# Patient Record
Sex: Male | Born: 1940 | Race: White | Hispanic: No | Smoking: Former smoker
Health system: Southern US, Community
[De-identification: ages and names within clinical notes are randomized; demographics above are authoritative.]

## PROBLEM LIST (undated history)

## (undated) DIAGNOSIS — I639 Cerebral infarction, unspecified: Secondary | ICD-10-CM

## (undated) DIAGNOSIS — J189 Pneumonia, unspecified organism: Secondary | ICD-10-CM

## (undated) DIAGNOSIS — I10 Essential (primary) hypertension: Secondary | ICD-10-CM

## (undated) DIAGNOSIS — A938 Other specified arthropod-borne viral fevers: Secondary | ICD-10-CM

## (undated) DIAGNOSIS — E7800 Pure hypercholesterolemia, unspecified: Secondary | ICD-10-CM

## (undated) DIAGNOSIS — K219 Gastro-esophageal reflux disease without esophagitis: Secondary | ICD-10-CM

## (undated) DIAGNOSIS — F209 Schizophrenia, unspecified: Secondary | ICD-10-CM

## (undated) HISTORY — PX: APPENDECTOMY: SHX54

## (undated) HISTORY — PX: ESOPHAGUS SURGERY: SHX626

---

## 2005-08-29 ENCOUNTER — Emergency Department (HOSPITAL_COMMUNITY): Admission: EM | Admit: 2005-08-29 | Discharge: 2005-08-29 | Payer: Self-pay | Admitting: Emergency Medicine

## 2005-09-12 ENCOUNTER — Ambulatory Visit (HOSPITAL_COMMUNITY): Admission: RE | Admit: 2005-09-12 | Discharge: 2005-09-12 | Payer: Self-pay | Admitting: Family Medicine

## 2006-07-18 DIAGNOSIS — I639 Cerebral infarction, unspecified: Secondary | ICD-10-CM

## 2006-07-18 HISTORY — DX: Cerebral infarction, unspecified: I63.9

## 2006-11-09 ENCOUNTER — Encounter: Payer: Self-pay | Admitting: Emergency Medicine

## 2006-11-10 ENCOUNTER — Inpatient Hospital Stay (HOSPITAL_COMMUNITY): Admission: EM | Admit: 2006-11-10 | Discharge: 2006-11-14 | Payer: Self-pay | Admitting: Emergency Medicine

## 2006-11-11 ENCOUNTER — Ambulatory Visit: Payer: Self-pay | Admitting: Internal Medicine

## 2006-11-28 ENCOUNTER — Ambulatory Visit: Payer: Self-pay | Admitting: Internal Medicine

## 2008-07-14 ENCOUNTER — Ambulatory Visit (HOSPITAL_COMMUNITY): Admission: RE | Admit: 2008-07-14 | Discharge: 2008-07-14 | Payer: Self-pay | Admitting: Family Medicine

## 2008-11-12 ENCOUNTER — Ambulatory Visit (HOSPITAL_COMMUNITY): Admission: RE | Admit: 2008-11-12 | Discharge: 2008-11-12 | Payer: Self-pay | Admitting: Family Medicine

## 2008-11-18 ENCOUNTER — Encounter: Payer: Self-pay | Admitting: Internal Medicine

## 2008-11-21 ENCOUNTER — Ambulatory Visit (HOSPITAL_COMMUNITY): Admission: RE | Admit: 2008-11-21 | Discharge: 2008-11-21 | Payer: Self-pay | Admitting: Family Medicine

## 2009-01-28 ENCOUNTER — Encounter (INDEPENDENT_AMBULATORY_CARE_PROVIDER_SITE_OTHER): Payer: Self-pay | Admitting: General Surgery

## 2009-01-28 ENCOUNTER — Inpatient Hospital Stay (HOSPITAL_COMMUNITY): Admission: EM | Admit: 2009-01-28 | Discharge: 2009-02-11 | Payer: Self-pay | Admitting: Emergency Medicine

## 2009-02-04 ENCOUNTER — Encounter: Payer: Self-pay | Admitting: Podiatry

## 2010-10-24 LAB — COMPREHENSIVE METABOLIC PANEL
ALT: 12 U/L (ref 0–53)
ALT: 9 U/L (ref 0–53)
ALT: <8 U/L (ref 0–53)
ALT: 8 U/L (ref 0–53)
AST: 17 U/L (ref 0–37)
AST: 19 U/L (ref 0–37)
AST: 17 U/L (ref 0–37)
Albumin: 1.5 g/dL — ABNORMAL LOW (ref 3.5–5.2)
Albumin: 1.8 g/dL — ABNORMAL LOW (ref 3.5–5.2)
Albumin: 1.9 g/dL — ABNORMAL LOW (ref 3.5–5.2)
Albumin: 2 g/dL — ABNORMAL LOW (ref 3.5–5.2)
Albumin: 2.6 g/dL — ABNORMAL LOW (ref 3.5–5.2)
Alkaline Phosphatase: 51 U/L (ref 39–117)
Alkaline Phosphatase: 53 U/L (ref 39–117)
Alkaline Phosphatase: 60 U/L (ref 39–117)
Alkaline Phosphatase: 51 U/L (ref 39–117)
BUN: 11 mg/dL (ref 6–23)
BUN: 18 mg/dL (ref 6–23)
BUN: 21 mg/dL (ref 6–23)
BUN: 24 mg/dL — ABNORMAL HIGH (ref 6–23)
BUN: 27 mg/dL — ABNORMAL HIGH (ref 6–23)
BUN: 26 mg/dL — ABNORMAL HIGH (ref 6–23)
BUN: 32 mg/dL — ABNORMAL HIGH (ref 6–23)
CO2: 30 mEq/L (ref 19–32)
CO2: 32 mEq/L (ref 19–32)
CO2: 33 mEq/L — ABNORMAL HIGH (ref 19–32)
Calcium: 7.7 mg/dL — ABNORMAL LOW (ref 8.4–10.5)
Calcium: 7.9 mg/dL — ABNORMAL LOW (ref 8.4–10.5)
Calcium: 8.1 mg/dL — ABNORMAL LOW (ref 8.4–10.5)
Calcium: 8.1 mg/dL — ABNORMAL LOW (ref 8.4–10.5)
Calcium: 8.2 mg/dL — ABNORMAL LOW (ref 8.4–10.5)
Calcium: 7.3 mg/dL — ABNORMAL LOW (ref 8.4–10.5)
Calcium: 7.4 mg/dL — ABNORMAL LOW (ref 8.4–10.5)
Chloride: 101 mEq/L (ref 96–112)
Chloride: 102 mEq/L (ref 96–112)
Chloride: 102 mEq/L (ref 96–112)
Chloride: 103 mEq/L (ref 96–112)
Chloride: 97 mEq/L (ref 96–112)
Creatinine, Ser: 0.73 mg/dL (ref 0.4–1.5)
Creatinine, Ser: 0.97 mg/dL (ref 0.4–1.5)
Creatinine, Ser: 1.03 mg/dL (ref 0.4–1.5)
Creatinine, Ser: 1.18 mg/dL (ref 0.4–1.5)
Creatinine, Ser: 1.27 mg/dL (ref 0.4–1.5)
Creatinine, Ser: 1.74 mg/dL — ABNORMAL HIGH (ref 0.4–1.5)
Creatinine, Ser: 2.69 mg/dL — ABNORMAL HIGH (ref 0.4–1.5)
GFR calc Af Amer: 56 mL/min — ABNORMAL LOW (ref >60)
GFR calc Af Amer: >60 mL/min (ref >60)
GFR calc Af Amer: >60 mL/min (ref >60)
GFR calc Af Amer: >60 mL/min (ref >60)
GFR calc Af Amer: 29 mL/min — ABNORMAL LOW (ref >60)
GFR calc non Af Amer: >60 mL/min (ref >60)
GFR calc non Af Amer: >60 mL/min (ref >60)
GFR calc non Af Amer: >60 mL/min (ref >60)
GFR calc non Af Amer: 39 mL/min — ABNORMAL LOW (ref >60)
Glucose, Bld: 101 mg/dL — ABNORMAL HIGH (ref 70–99)
Glucose, Bld: 109 mg/dL — ABNORMAL HIGH (ref 70–99)
Glucose, Bld: 121 mg/dL — ABNORMAL HIGH (ref 70–99)
Glucose, Bld: 111 mg/dL — ABNORMAL HIGH (ref 70–99)
Potassium: 4.5 mEq/L (ref 3.5–5.1)
Potassium: 4.6 mEq/L (ref 3.5–5.1)
Potassium: 4.6 mEq/L (ref 3.5–5.1)
Potassium: 4.3 mEq/L (ref 3.5–5.1)
Potassium: 4.4 mEq/L (ref 3.5–5.1)
Sodium: 136 mEq/L (ref 135–145)
Sodium: 139 mEq/L (ref 135–145)
Sodium: 140 mEq/L (ref 135–145)
Sodium: 142 mEq/L (ref 135–145)
Sodium: 134 mEq/L — ABNORMAL LOW (ref 135–145)
Total Bilirubin: 0.4 mg/dL (ref 0.3–1.2)
Total Bilirubin: 0.6 mg/dL (ref 0.3–1.2)
Total Bilirubin: 0.7 mg/dL (ref 0.3–1.2)
Total Bilirubin: 0.7 mg/dL (ref 0.3–1.2)
Total Bilirubin: 0.4 mg/dL (ref 0.3–1.2)
Total Protein: 4.8 g/dL — ABNORMAL LOW (ref 6.0–8.3)
Total Protein: 4.9 g/dL — ABNORMAL LOW (ref 6.0–8.3)
Total Protein: 5 g/dL — ABNORMAL LOW (ref 6.0–8.3)
Total Protein: 5.1 g/dL — ABNORMAL LOW (ref 6.0–8.3)
Total Protein: 5.8 g/dL — ABNORMAL LOW (ref 6.0–8.3)
Total Protein: 5 g/dL — ABNORMAL LOW (ref 6.0–8.3)
Total Protein: 5.2 g/dL — ABNORMAL LOW (ref 6.0–8.3)
Total Protein: 5.3 g/dL — ABNORMAL LOW (ref 6.0–8.3)

## 2010-10-24 LAB — DIFFERENTIAL
Basophils Absolute: 0 10*3/uL (ref 0.0–0.1)
Basophils Absolute: 0.1 10*3/uL (ref 0.0–0.1)
Basophils Absolute: 0.2 10*3/uL — ABNORMAL HIGH (ref 0.0–0.1)
Basophils Absolute: 0 10*3/uL (ref 0.0–0.1)
Basophils Absolute: 0 10*3/uL (ref 0.0–0.1)
Basophils Relative: 0 % (ref 0–1)
Basophils Relative: 1 % (ref 0–1)
Eosinophils Absolute: 0.2 10*3/uL (ref 0.0–0.7)
Eosinophils Absolute: 0.2 10*3/uL (ref 0.0–0.7)
Eosinophils Absolute: 0.3 10*3/uL (ref 0.0–0.7)
Eosinophils Absolute: 0.3 10*3/uL (ref 0.0–0.7)
Eosinophils Absolute: 0.4 10*3/uL (ref 0.0–0.7)
Eosinophils Relative: 1 % (ref 0–5)
Eosinophils Relative: 2 % (ref 0–5)
Eosinophils Relative: 3 % (ref 0–5)
Eosinophils Relative: 0 % (ref 0–5)
Eosinophils Relative: 0 % (ref 0–5)
Lymphocytes Relative: 10 % — ABNORMAL LOW (ref 12–46)
Lymphocytes Relative: 12 % (ref 12–46)
Lymphocytes Relative: 12 % (ref 12–46)
Lymphocytes Relative: 15 % (ref 12–46)
Lymphocytes Relative: 9 % — ABNORMAL LOW (ref 12–46)
Lymphocytes Relative: 5 % — ABNORMAL LOW (ref 12–46)
Lymphocytes Relative: 6 % — ABNORMAL LOW (ref 12–46)
Lymphs Abs: 1.2 10*3/uL (ref 0.7–4.0)
Lymphs Abs: 1.6 10*3/uL (ref 0.7–4.0)
Lymphs Abs: 1.6 10*3/uL (ref 0.7–4.0)
Lymphs Abs: 2 10*3/uL (ref 0.7–4.0)
Lymphs Abs: 2.1 10*3/uL (ref 0.7–4.0)
Lymphs Abs: 2.2 10*3/uL (ref 0.7–4.0)
Lymphs Abs: 2.3 10*3/uL (ref 0.7–4.0)
Lymphs Abs: 1.2 10*3/uL (ref 0.7–4.0)
Lymphs Abs: 1.3 10*3/uL (ref 0.7–4.0)
Monocytes Absolute: 1.4 10*3/uL — ABNORMAL HIGH (ref 0.1–1.0)
Monocytes Absolute: 1.4 10*3/uL — ABNORMAL HIGH (ref 0.1–1.0)
Monocytes Absolute: 1.5 10*3/uL — ABNORMAL HIGH (ref 0.1–1.0)
Monocytes Absolute: 1.5 10*3/uL — ABNORMAL HIGH (ref 0.1–1.0)
Monocytes Absolute: 1.6 10*3/uL — ABNORMAL HIGH (ref 0.1–1.0)
Monocytes Absolute: 2.3 10*3/uL — ABNORMAL HIGH (ref 0.1–1.0)
Monocytes Absolute: 0.5 10*3/uL (ref 0.1–1.0)
Monocytes Absolute: 0.7 10*3/uL (ref 0.1–1.0)
Monocytes Relative: 13 % — ABNORMAL HIGH (ref 3–12)
Monocytes Relative: 13 % — ABNORMAL HIGH (ref 3–12)
Monocytes Relative: 7 % (ref 3–12)
Monocytes Relative: 7 % (ref 3–12)
Monocytes Relative: 8 % (ref 3–12)
Monocytes Relative: 8 % (ref 3–12)
Monocytes Relative: 9 % (ref 3–12)
Monocytes Relative: 9 % (ref 3–12)
Monocytes Relative: 2 % — ABNORMAL LOW (ref 3–12)
Monocytes Relative: 3 % (ref 3–12)
Monocytes Relative: 3 % (ref 3–12)
Neutro Abs: 10 10*3/uL — ABNORMAL HIGH (ref 1.7–7.7)
Neutro Abs: 13.3 10*3/uL — ABNORMAL HIGH (ref 1.7–7.7)
Neutro Abs: 13.7 10*3/uL — ABNORMAL HIGH (ref 1.7–7.7)
Neutro Abs: 14.6 10*3/uL — ABNORMAL HIGH (ref 1.7–7.7)
Neutro Abs: 14.7 10*3/uL — ABNORMAL HIGH (ref 1.7–7.7)
Neutro Abs: 14.5 10*3/uL — ABNORMAL HIGH (ref 1.7–7.7)
Neutro Abs: 17.4 10*3/uL — ABNORMAL HIGH (ref 1.7–7.7)
Neutro Abs: 20 10*3/uL — ABNORMAL HIGH (ref 1.7–7.7)
Neutrophils Relative %: 76 % (ref 43–77)
Neutrophils Relative %: 76 % (ref 43–77)
Neutrophils Relative %: 82 % — ABNORMAL HIGH (ref 43–77)
Neutrophils Relative %: 90 % — ABNORMAL HIGH (ref 43–77)
WBC Morphology: INCREASED

## 2010-10-24 LAB — CBC
HCT: 29 % — ABNORMAL LOW (ref 39.0–52.0)
HCT: 29.8 % — ABNORMAL LOW (ref 39.0–52.0)
HCT: 30.4 % — ABNORMAL LOW (ref 39.0–52.0)
HCT: 30.5 % — ABNORMAL LOW (ref 39.0–52.0)
HCT: 30.9 % — ABNORMAL LOW (ref 39.0–52.0)
HCT: 31.3 % — ABNORMAL LOW (ref 39.0–52.0)
HCT: 34.2 % — ABNORMAL LOW (ref 39.0–52.0)
HCT: 34.4 % — ABNORMAL LOW (ref 39.0–52.0)
HCT: 34.6 % — ABNORMAL LOW (ref 39.0–52.0)
HCT: 36.3 % — ABNORMAL LOW (ref 39.0–52.0)
Hemoglobin: 10.2 g/dL — ABNORMAL LOW (ref 13.0–17.0)
Hemoglobin: 10.3 g/dL — ABNORMAL LOW (ref 13.0–17.0)
Hemoglobin: 10.6 g/dL — ABNORMAL LOW (ref 13.0–17.0)
Hemoglobin: 10.9 g/dL — ABNORMAL LOW (ref 13.0–17.0)
Hemoglobin: 12 g/dL — ABNORMAL LOW (ref 13.0–17.0)
Hemoglobin: 12 g/dL — ABNORMAL LOW (ref 13.0–17.0)
Hemoglobin: 12.7 g/dL — ABNORMAL LOW (ref 13.0–17.0)
MCHC: 34.2 g/dL (ref 30.0–36.0)
MCHC: 34.3 g/dL (ref 30.0–36.0)
MCHC: 34.7 g/dL (ref 30.0–36.0)
MCHC: 34.8 g/dL (ref 30.0–36.0)
MCHC: 34.7 g/dL (ref 30.0–36.0)
MCV: 92.3 fL (ref 78.0–100.0)
MCV: 93 fL (ref 78.0–100.0)
MCV: 93.2 fL (ref 78.0–100.0)
MCV: 93.4 fL (ref 78.0–100.0)
MCV: 93.6 fL (ref 78.0–100.0)
MCV: 93.7 fL (ref 78.0–100.0)
MCV: 93.7 fL (ref 78.0–100.0)
MCV: 93.7 fL (ref 78.0–100.0)
MCV: 93.9 fL (ref 78.0–100.0)
MCV: 94.3 fL (ref 78.0–100.0)
MCV: 93 fL (ref 78.0–100.0)
MCV: 93.7 fL (ref 78.0–100.0)
Platelets: 291 10*3/uL (ref 150–400)
Platelets: 397 10*3/uL (ref 150–400)
Platelets: 422 10*3/uL — ABNORMAL HIGH (ref 150–400)
Platelets: 425 10*3/uL — ABNORMAL HIGH (ref 150–400)
Platelets: 444 10*3/uL — ABNORMAL HIGH (ref 150–400)
Platelets: 451 10*3/uL — ABNORMAL HIGH (ref 150–400)
Platelets: 228 10*3/uL (ref 150–400)
RBC: 3.14 MIL/uL — ABNORMAL LOW (ref 4.22–5.81)
RBC: 3.21 MIL/uL — ABNORMAL LOW (ref 4.22–5.81)
RBC: 3.25 MIL/uL — ABNORMAL LOW (ref 4.22–5.81)
RBC: 3.27 MIL/uL — ABNORMAL LOW (ref 4.22–5.81)
RBC: 3.28 MIL/uL — ABNORMAL LOW (ref 4.22–5.81)
RBC: 3.34 MIL/uL — ABNORMAL LOW (ref 4.22–5.81)
RBC: 3.4 MIL/uL — ABNORMAL LOW (ref 4.22–5.81)
RBC: 3.91 MIL/uL — ABNORMAL LOW (ref 4.22–5.81)
RDW: 14.2 % (ref 11.5–15.5)
RDW: 14.4 % (ref 11.5–15.5)
RDW: 14.5 % (ref 11.5–15.5)
RDW: 14.9 % (ref 11.5–15.5)
RDW: 13.3 % (ref 11.5–15.5)
RDW: 13.5 % (ref 11.5–15.5)
RDW: 13.6 % (ref 11.5–15.5)
WBC: 12.2 10*3/uL — ABNORMAL HIGH (ref 4.0–10.5)
WBC: 17.1 10*3/uL — ABNORMAL HIGH (ref 4.0–10.5)
WBC: 17.4 10*3/uL — ABNORMAL HIGH (ref 4.0–10.5)
WBC: 17.4 10*3/uL — ABNORMAL HIGH (ref 4.0–10.5)
WBC: 17.7 10*3/uL — ABNORMAL HIGH (ref 4.0–10.5)
WBC: 18.4 10*3/uL — ABNORMAL HIGH (ref 4.0–10.5)
WBC: 18.4 10*3/uL — ABNORMAL HIGH (ref 4.0–10.5)
WBC: 19.3 10*3/uL — ABNORMAL HIGH (ref 4.0–10.5)
WBC: 22 10*3/uL — ABNORMAL HIGH (ref 4.0–10.5)

## 2010-10-24 LAB — BASIC METABOLIC PANEL
BUN: 16 mg/dL (ref 6–23)
BUN: 8 mg/dL (ref 6–23)
Chloride: 102 mEq/L (ref 96–112)
Chloride: 104 mEq/L (ref 96–112)
Chloride: 105 mEq/L (ref 96–112)
Chloride: 107 mEq/L (ref 96–112)
Creatinine, Ser: 0.83 mg/dL (ref 0.4–1.5)
Creatinine, Ser: 0.91 mg/dL (ref 0.4–1.5)
GFR calc Af Amer: >60 mL/min (ref >60)
GFR calc Af Amer: >60 mL/min (ref >60)
GFR calc non Af Amer: >60 mL/min (ref >60)
GFR calc non Af Amer: >60 mL/min (ref >60)
Potassium: 4.3 mEq/L (ref 3.5–5.1)
Potassium: 4.6 mEq/L (ref 3.5–5.1)
Potassium: 4.7 mEq/L (ref 3.5–5.1)
Sodium: 139 mEq/L (ref 135–145)
Sodium: 141 mEq/L (ref 135–145)

## 2010-10-24 LAB — POCT CARDIAC MARKERS
CKMB, poc: 1.2 ng/mL (ref 1.0–8.0)
Myoglobin, poc: >500 ng/mL (ref 12–200)

## 2010-10-24 LAB — URINE CULTURE: Colony Count: NO GROWTH

## 2010-10-24 LAB — PHOSPHORUS
Phosphorus: 2.8 mg/dL (ref 2.3–4.6)
Phosphorus: 3.1 mg/dL (ref 2.3–4.6)
Phosphorus: 3.7 mg/dL (ref 2.3–4.6)
Phosphorus: 4.3 mg/dL (ref 2.3–4.6)
Phosphorus: 2.8 mg/dL (ref 2.3–4.6)
Phosphorus: 3.5 mg/dL (ref 2.3–4.6)

## 2010-10-24 LAB — CULTURE, ROUTINE-ABSCESS

## 2010-10-24 LAB — CULTURE, BLOOD (ROUTINE X 2)
Culture: NO GROWTH
Culture: NO GROWTH
Report Status: 7292010

## 2010-10-24 LAB — TYPE AND SCREEN: Antibody Screen: NEGATIVE

## 2010-10-24 LAB — PROTIME-INR: INR: 1.4 (ref 0.00–1.49)

## 2010-10-24 LAB — GLUCOSE, CAPILLARY: Glucose-Capillary: 126 mg/dL — ABNORMAL HIGH (ref 70–99)

## 2010-10-24 LAB — MAGNESIUM
Magnesium: 2 mg/dL (ref 1.5–2.5)
Magnesium: 2.1 mg/dL (ref 1.5–2.5)
Magnesium: 2.1 mg/dL (ref 1.5–2.5)
Magnesium: 2.2 mg/dL (ref 1.5–2.5)
Magnesium: 2.5 mg/dL (ref 1.5–2.5)
Magnesium: 2.6 mg/dL — ABNORMAL HIGH (ref 1.5–2.5)
Magnesium: 2.8 mg/dL — ABNORMAL HIGH (ref 1.5–2.5)

## 2010-10-24 LAB — ANAEROBIC CULTURE

## 2010-10-24 LAB — URINALYSIS, ROUTINE W REFLEX MICROSCOPIC
Bilirubin Urine: NEGATIVE
Protein, ur: 100 mg/dL — AB
Urobilinogen, UA: 0.2 mg/dL (ref 0.0–1.0)

## 2010-10-24 LAB — VALPROIC ACID LEVEL: Valproic Acid Lvl: 60 ug/mL (ref 50.0–100.0)

## 2010-10-24 LAB — AMMONIA: Ammonia: 15 umol/L (ref 11–35)

## 2010-11-30 NOTE — Group Therapy Note (Signed)
NAMEAYDENN, Roger Frye                 ACCOUNT NO.:  [id number]   MEDICAL RECORD NO.:  [id number]          PATIENT TYPE:  INP   LOCATION:  A341                          FACILITY:  [facility]   PHYSICIAN:  Mila Homer. Sudie Bailey, M.D.DATE OF BIRTH:  01-26-1941   DATE OF PROCEDURE:  DATE OF DISCHARGE:                                 PROGRESS NOTE   SUBJECTIVE:  His brother said he was awake more last night.  He does not  seem to have changed his speech patterns.  His brother thinks he has  problem with his tongue.   OBJECTIVE:  Temperature is 97.3, pulse 93, respiratory rate 18, blood  pressure 134/63.  He is edentulous, at least in his upper jaw.  He is  still talking and sentence structure is barely perceived to be normal,  but his use of words is very, very garbled.  His O2 sat is 94% on 2 L  and 91% on 2 L.  His color is pretty good.  His lungs show decreased  breath sounds throughout, but he is moving air well, no intercostal  retraction, no use of accessory muscles of respiration.  Heart is a  regular rhythm without murmur rate of 90.  Abdomen fairly soft without  tenderness and a drain is still in place.   His white cell count today remains high at 18,400 of which 81%  neutrophils, 9 lymphs.  Hemoglobin 10.4.  MET 7 shows a bicarb of 33 and  albumin of 1.5.   His CT of the abdomen and pelvis done yesterday showed an interval  appendectomy and a collection of peritoneal fluid in a subhepatic  location measuring 12.8 x 5.1 x 7.5 cm, another loculated fluid  collection in the mesentery to right lower quadrant.  There was  inflammatory edematous changes in the mesenteric fat, 2 either fluid  collections in the pelvis, one measuring 4 x 4.5 x 6.9 the other 3.3 x  3.0 x 5.4 cm.  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   ASSESSMENT:  1. Status post surgery for ruptured appendix.  2.  Question abdominal abscesses.  3. Bacteremia secondary to Bacteroides fragilis beta-lactamase      positive.  4. Chronic obstructive pulmonary disease.  5. Tobacco use disorder.  6. Difficulty talking which may be due to medication or generalized      sickness.   PLAN:  We will discuss with his surgeon, Dr. Tilford Pillar.  He may need  to have tapping of these fluid collections in the abdomen.  We will  increase his Zyprexa again to 20 mg at bedtime to get him sleep better  at night.  I think decrease in the dose has not helped his speech.      Mila Homer. Sudie Bailey, M.D.  Electronically Signed     SDK/MEDQ  D:  02/03/2009  T:  02/04/2009  Job:  [id number]

## 2010-11-30 NOTE — Discharge Summary (Signed)
NAMEKYLEN, SCHLIEP                 ACCOUNT NO.:  [id number]   MEDICAL RECORD NO.:  [id number]          PATIENT TYPE:  INP   LOCATION:  A341                          FACILITY:  [facility]   PHYSICIAN:  Tilford Pillar, MD      DATE OF BIRTH:  April 06, 1941   DATE OF ADMISSION:  01/28/2009  DATE OF DISCHARGE:  07/28/2010LH                               DISCHARGE SUMMARY   ADMISSION DIAGNOSIS:  Acute appendicitis.   DISCHARGE DIAGNOSES:  1. Perforated appendicitis status post laparoscopic appendectomy.  2. Hypertension.  3. History of cerebrovascular accident.  4. Seizure disorder.  5. History of upper gastrointestinal bleed.  6. Schizophrenia.  7. History of ventral hernia.  8. Mild mental retardation and developmental deficiency.  9. Urinary retention.   PROCEDURES:  1. Laparoscopic appendectomy.  2. Percutaneous drain via interventional radiology placement.   DISPOSITION:  Extended care facility for continued rehab.   BRIEF HISTORY OF PRESENT ILLNESS:  Please see the admission History and  Physical for the complete H and P.  The patient is a 70 year old male  well cared for by his brothers who presented with increasing abdominal  pain, nausea and vomiting.  On evaluation, he had signs and symptoms  consistent with appendicitis.  He did not have any diffuse  symptomatology.  It was recommended he go to the operating room for  emergent laparoscopic appendectomy and possible open appendectomy.  He  was admitted for the planned procedure and continued management.   HOSPITAL COURSE:  The patient was admitted on January 28, 2009.  He  underwent the above mentioned laparoscopic appendectomy at which time it  was noted that the patient had diffuse purulent fluid within the  abdomen.  The appendix was removed in standard fashion and the abdomen  was irrigated with a copious amounts of sterile saline.  Please see the  operative report for details on the actual procedure.  Following the  procedure, he was initially transferred to the intensive care unit.  He  was monitored over the subsequent days and eventually transferred to a  surgical floor.  His hospital course was somewhat delayed due to slow  return of bowel function and persistent leukocytosis.  During his  postoperative course due to his persistent leukocytosis, I did opt to  proceed with re-evaluating the abdomen with CT evaluation.  This did  demonstrate an extremely large right pericolic gutter collection as well  as interloop fluid collections.  These did not have the classic  appearance of an abscess, however due to the persistent leukocytosis I  did request interventional radiology consultation.  The patient was  transferred briefly to [facility] for interventional radiology placement  of a percutaneous drain.  Fluid was aspirated.  It was clear and no  evidence of any overt abscess.  He was continued on the antibiotics and  had a constant leukocytosis of 17,000.  Overall his symptomatology  improved greatly.  He did have return of bowel function.  He was started  back on a diet.  He tolerated the diet extremely well, he continued to  have bowel  function with the persistent leukocytosis with no evidence of  any febrile episodes and no clear evidence of infectious etiology on  chest x-ray, blood cultures or urinalysis as well as no suspicion of  intra-abdominal undrained abscess I opted at this time to discontinue  his antibiotics.  Immediately following this, he did have slow trending  down of his white blood cell count being off of all antibiotics making  me suspicious that his leukocytosis was medication induced.  At this  time the patient has tolerated a regular diet, he is comfortable, he is  very alert and is ready to be discharged for continued rehab to increase  his strength.  Plans are made for discharge on February 10, 2009 being  transferred on February 11, 2009.   DISCHARGE INSTRUCTIONS:  The patient  will be continued on his drains.  He will be continued on diet as tolerated.  Drains will be removed as an  outpatient in my office.  He will continue to get physical therapy to  increase his strength.  The patient will be discharged with a Foley  catheter which was placed due to some urinary retention, likely due to a  benign urethral or prostatic process.  Again as the patient has  continued to improve and diurese, this will be removed as an outpatient  over the next week as well as discussed with the family and the patient.      Tilford Pillar, MD  Electronically Signed     BZ/MEDQ  D:  02/11/2009  T:  02/11/2009  Job:  [id number]

## 2010-11-30 NOTE — H&P (Signed)
Roger Frye, Roger Frye                 ACCOUNT NO.:  [id number]   MEDICAL RECORD NO.:  [id number]          PATIENT TYPE:  INP   LOCATION:  IC04                          FACILITY:  [facility]   PHYSICIAN:  Tilford Pillar, MD      DATE OF BIRTH:  1940/12/25   DATE OF ADMISSION:  01/28/2009  DATE OF DISCHARGE:  LH                              HISTORY & PHYSICAL   CHIEF COMPLAINT:  Abdominal pain, dehydration.   HISTORY OF PRESENT ILLNESS:  The patient is a 70 year old male with a  history of hypertension, seizure disorder, and prior CVA who presents  with approximately 3 days increasing abdominal pain that started  periumbilically with localization to the right lower quadrant, it is  sharp and is constant.  It is exacerbated with palpation and movements.  He has had no fever or chills.  He has had decrease in appetite.  He has  had diminished bowel function over the last 1-2 days.  He has had  associated nausea and some small amounts of nonbloody emesis.  He has  had no similar episodes in the past.  Over this time, he has also  complained of some shortness of breath with some cough, which has  developed over the last 1-2 days with the increasing abdominal pain.  This has been nonproductive.  He has had again no fever or chills.  No  sick contacts.   PAST MEDICAL HISTORY:  Hypertension, cerebrovascular accident, seizure  disorder with no recent seizure activity, history of upper  gastrointestinal bleed with a Mallory-Weiss tear after forceful emesis  and choking, schizophrenia, ventral hernia.   PAST SURGICAL HISTORY:  None.   MEDICATIONS:  Simvastatin, lorazepam, Zyprexa, oxybutynin, divalproex,  aspirin, and enalapril.   ALLERGIES:  No known drug allergies.   SOCIAL HISTORY:  He is a current tobacco smoker.  No alcohol use.  No  recreational drug use.   REVIEW OF SYSTEMS:  CONSTITUTIONAL:  Unremarkable.  EYES:  Unremarkable.  EARS, NOSE AND THROAT:  Unremarkable.  RESPIRATORY: Some  cough over the  last couple days as per HPI.  CARDIOVASCULAR:  Unremarkable.  No  complaint of chest pain.  No palpitations.  GASTROINTESTINAL:  As per  HPI.  GENITOURINARY:  Decreased urine output today, otherwise  unremarkable.  MUSCULOSKELETAL:  He does have some left-sided weakness  and does have some weakness secondarily to his past cerebrovascular  accident.  SKIN:  Unremarkable.  NEURO:  Unremarkable other than HPI and  history of CVA.   PHYSICAL EXAMINATION:  VITAL SIGNS:  Temperature 98.1, heart rate 102,  respirations 20, blood pressure 132/67.  GENERAL:  The patient is lying in a supine position in the emergency  department.  He is alert and oriented.  He is not in any acute distress.  His speech is somewhat difficult to understand.  The patient and brother  who is present says this has improved since his admission, and has not  been consistent with his prior history of stroke.  He feels that it is  difficult to verbalize secondarily to his dry mouth.  HEENT:  Scalp, no deformities.  Eyes, pupils were equal, round, and  reactive.  Extraocular movements are intact.  No scleral icterus is  noted.  No conjunctival pallor is appreciated.  Oral mucosa is pink and  is tacky at this time.  Trachea is midline.  PULMONARY:  Unlabored respirations.  He does have left lower lung field  crackles with deep inhalation.  No rhonchi are appreciated.  Breath  sounds on the right appear clear throughout lung fields.  CARDIOVASCULAR:  Regular rate and rhythm.  ABDOMEN:  Diminished bowel sounds.  Abdomen is soft, obese, and is  moderately distended.  He does have positive right lower quadrant pain.  At [place] point, he has some voluntary guarding in this area.  He has  no diffuse peritoneal signs noted.  No appreciated Rovsing sign.  He  does have a palpable ventral/epigastric hernia.  This is nonreducible.  EXTREMITIES:  Warm and dry.   PERTINENT LABORATORY AND RADIOGRAPHIC STUDIES:  CBC,  white blood cell  count 22, hemoglobin 12.1, hematocrit 35.1, platelets 239.  He has a  shift with neutrophil count of 91.  Basic metabolic panel, sodium 133,  potassium 4.3, chloride 97, bicarb 32, BUN 45, creatinine 2.69, blood  glucose 128.  UA demonstrates positive blood and casts.  No leukocytes.  CT of the abdomen demonstrates no evidence of any free air.  There is  some small amount of what appears to be free pelvic fluid, although this  is noncontrast CT due to his elevated creatinine.  There is appendiceal  dilatation noted.  There is also some opacification and consolidation of  the left lung base.   ASSESSMENT AND PLAN:  1. Acute appendicitis.  2. Acute renal insufficiency.  3. Dehydration.  4. Chronic incarcerated and epigastric ventral hernia.  5. Atelectatic changes of the left lung base.   Plan at this point is to continue the patient on an n.p.o. status and  continue aggressive IV fluid hydration.  He is currently receiving his  third liter of fluid with increasing urine output noted with his Foley  catheter.  The Foley will be hooked to gravity drainage and will be kept  in place during this resuscitation.  At this time, he has been given  Rocephin by the emergency room physician and this will be used for his  coverage for planned operative intervention.  Risks, benefits, and  alternatives of laparoscopic possible open appendectomy were discussed  at length with the patient including but not limited to risk of  bleeding, infection, small-bowel injury, appendiceal stump leak, as well  as possibility of intraoperative cardiac and pulmonary events.  At this  time due to his moderate amount of distention, I also discussed with the  patient the likely requirement to convert to an open procedure given the  patient's scheduling issue, we will plan to place a nasogastric tube at  the time of presenting to the operating room.  Additionally, DVT  prophylaxis will be  administered including Lovenox, bilateral thigh high  TED hose and bilateral SCDs.  The patient will proceed upon  resuscitation to the operating room for an emergent appendectomy.      Tilford Pillar, MD  Electronically Signed    BZ/MEDQ  D:  01/28/2009  T:  01/29/2009  Job:  [id number]   cc:   Mila Homer. Sudie Bailey, M.D.  Fax: [phone]

## 2010-11-30 NOTE — Group Therapy Note (Signed)
Roger Frye, Roger Frye                 ACCOUNT NO.:  [id number]   MEDICAL RECORD NO.:  [id number]          PATIENT TYPE:  INP   LOCATION:  A341                          FACILITY:  [facility]   PHYSICIAN:  Mila Homer. Sudie Bailey, M.D.DATE OF BIRTH:  1940-09-25   DATE OF PROCEDURE:  DATE OF DISCHARGE:                                 PROGRESS NOTE   SUBJECTIVE:  He continues to recover from his ruptured appendix.  Brother is in the room with him today.   OBJECTIVE:  His temperature is 100.5, pulse is 81, respiratory rate 21,  blood pressure 112/60.  It is hard to understand, but his sentence  structure appears to be normal and his brother is able to translate for  me.  He is 5/5 grip strength bilaterally.  Lungs show decreased breath  sounds throughout, but he is moving air well.  His color is okay.  His  heart has a regular rhythm rate of about 80.  The abdomen is soft and  there is a drain in the lower aspect draining a serous material.  There  is no edema of the ankles.   Today, his white cell count is 19,300, of which 76% neutrophils, 10  lymphs.  Hemoglobins is 10.5.  MET-7 is essentially normal.  Phosphorous  is 3.1, magnesium 2.2.   One out of two blood culture bottles grew out bacteroides fragilis,  which is beta-lactamase positive.   ASSESSMENT:  1. Bacteremia, probably secondary to ruptured appendix.  2. Status post appendectomy for a ruptured appendix.  3. Chronic obstructive pulmonary disease.  4. Tobacco use disorder.  5. Difficulty talking.  This may be secondary to not having his upper      dentures in or he may be knocked out medication just from being      sick.   PLAN:  Continue current regimen.  May need to adjust his antibiotics.  Currently, he is on cefoxitin and metronidazole.  Decrease his Zyprexa  from 20 to 10 mg nightly and see if he becomes little bit more alert  with this.  The other point is that he has been off cigarettes now for  some days where as he had been  smoking heavily for years.      Mila Homer. Sudie Bailey, M.D.  Electronically Signed     SDK/MEDQ  D:  02/02/2009  T:  02/02/2009  Job:  [id number]

## 2010-11-30 NOTE — Group Therapy Note (Signed)
NAMEBRIGHTON, Roger Frye                 ACCOUNT NO.:  [id number]   MEDICAL RECORD NO.:  [id number]         PATIENT TYPE:  PINP   LOCATION:  A341                          FACILITY:  [facility]   PHYSICIAN:  Mila Homer. Sudie Bailey, M.D.DATE OF BIRTH:  1940-09-26   DATE OF PROCEDURE:  DATE OF DISCHARGE:  02/11/2009                                 PROGRESS NOTE   SUBJECTIVE:  He is chatting somewhat today begin difficult to understand  and appeared brothers in the room with him.   OBJECTIVE:  Temperature 98.2, pulse 79, respiratory rate 20, blood  pressure 128/74.  O2 sats 95% 3 L by nasal cannula.  Aerobic and  anaerobic cultures of the abscess in the right pericolic gutter are  negative 3 days.   He is supine in bed.  No acute distress.  Well-developed and obese.  His  speech is difficult to understand, but this has been the case even  before he was hospitalized.  I can tell he does have normal sentence  structure, but the language still is somewhat garbled (it was much  better 3 days ago).  His heart has a regular rhythm rate of 80.  The  lungs appeared to be clear throughout.  He is moving air well.  There is  somewhat decreased breath sounds, however.  The abdomen is soft and  obese without organomegaly or mass or tenderness, but he does not have  clear urine from his Foley catheter and a serosanguineous drainage from  his tube and into the lower abdominal abscess.   White cell count today is 13,900, which is improved.  Hemoglobin 10.2.  CMP is normal except albumin 1.9.   ASSESSMENT:  1. Status post ruptured appendix.  2. 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  3. Abdominal abscesses which continue culture negative.  4. Chronic obstructive pulmonary disease.   PLAN:  Continue with current regimen of IV fluids and antibiotics and  gradual convalescence.      Mila Homer. Sudie Bailey, M.D.  Electronically Signed     SDK/MEDQ  D:  02/09/2009  T:  02/10/2009   Job:  [id number]

## 2010-11-30 NOTE — Group Therapy Note (Signed)
Roger Frye, Roger Frye                 ACCOUNT NO.:  [id number]   MEDICAL RECORD NO.:  [id number]          PATIENT TYPE:  INP   LOCATION:  A341                          FACILITY:  [facility]   PHYSICIAN:  Roger Frye. Sudie Bailey, M.D.DATE OF BIRTH:  1941-03-28   DATE OF PROCEDURE:  DATE OF DISCHARGE:                                 PROGRESS NOTE   SUBJECTIVE:  He does not have much to say today except that once in a  week he is going home.  Yesterday, he down to [facility]  interventional Radiology tapped the fluid collection in his right  pericolic gutter.   OBJECTIVE:  VITAL SIGNS:  Temperature is 97.7, pulse 81, respiratory  rate 18, blood pressure 117/66.  GENERAL:  He is supine in bed.  No acute distress.  He is a well-  developed and obese.  His brother is in attendance.  LUNGS:  Show decreased breath sounds throughout and he has occasional  rhonchi, but he is moving air well.  Color is good.  There were no  intercostal retractions.  No use of accessory muscles for respiration.  HEART:  Regular rhythm rate about 80.  ABDOMEN:  Soft without tenderness.   His white cell count today is 17,100 of which 76% neutrophils, 12  lymphs.  Hemoglobin 10.7.  METS-7 normal.  Intervention  Radiology  drained at least 40 mL of serosanguineous exudate of fluid from his  abdomen left drain in place.  Gram stain shows abundant WBCs, but no  bacteria.  There was no growth at 1 day.   ASSESSMENT:  1. Sepsis secondary to bacterial esophagitis.  2. Status post ruptured appendix.  3. Probable abdominal abscesses secondary to the rupture.  4. Chronic obstructive pulmonary disease.  5. Tobacco use disorder, much improved.  6. Persistent difference of speech.   PLAN:  Continue IV antibiotics IV fluids.  Culture results pending on  the exudative fluid which was removed yesterday.  I discussed with his  brother.      Roger Frye. Sudie Bailey, M.D.  Electronically Signed     SDK/MEDQ  D:  02/05/2009   T:  02/06/2009  Job:  [id number]

## 2010-11-30 NOTE — Group Therapy Note (Signed)
NAMECAYLEB, Roger Frye                 ACCOUNT NO.:  [id number]   MEDICAL RECORD NO.:  [id number]          PATIENT TYPE:  INP   LOCATION:  A341                          FACILITY:  [facility]   PHYSICIAN:  Mila Homer. Sudie Bailey, M.D.DATE OF BIRTH:  [birth year]   DATE OF PROCEDURE:  DATE OF DISCHARGE:                                 PROGRESS NOTE   SUBJECTIVE:  He is feeling better today and actually he is able to talk  some.   OBJECTIVE:  VITAL SIGNS:  Temperature is 97.4, pulse 83, respiratory  rate 18, blood pressure 140/75, and O2 sat is 92% on 3 L.  GENERAL:  Sitting in bed, somewhat recumbent, smiling and is actually  talking in sentences that are more easily understood than anything apart  from since his hospitalization.  Looks more energetic.  Color is better.  LUNGS:  Still show decreased breath sounds throughout, but he is moving  air well without intercostal retraction, without use of accessory  muscles for respiration.  HEART:  Regular rhythm, rate about 80.  ABDOMEN:  Soft without organomegaly or mass.   His white cell count today is 17,400 with 78% neutrophils, 12 lymphs.  Hemoglobin 10.3 and bicarb of 34, glucose 109, but his renal function is  normal.  The culture from his abscess showed no growth from 2 days, both  aerobic and anaerobic.   ASSESSMENT:  1. Bacillus fragilis sepsis secondary to a ruptured appendix.  2. Status post ruptured appendix.  3. Abdominal abscesses, but culture negative at this point.  4. Chronic obstructive pulmonary disease.   PLAN:  Continue current regimen, IV fluids, and IV antibiotics since he  is improving so nicely.  Daily CBCs to make sure his counts probably  drop.  We discussed with the patient and his brother.      Mila Homer. Sudie Bailey, M.D.  Electronically Signed     SDK/MEDQ  D:  02/06/2009  T:  02/06/2009  Job:  [id number]

## 2010-11-30 NOTE — Group Therapy Note (Signed)
Roger Frye, Roger Frye                 ACCOUNT NO.:  [id number]   MEDICAL RECORD NO.:  [id number]          PATIENT TYPE:  INP   LOCATION:  IC04                          FACILITY:  [facility]   PHYSICIAN:  Mila Homer. Sudie Bailey, M.D.DATE OF BIRTH:  January 06, 1941   DATE OF PROCEDURE:  DATE OF DISCHARGE:                                 PROGRESS NOTE   SUBJECTIVE:  This 70 year old was admitted to the hospital 2 days ago  with a rupture of appendix.  Dr. Tilford Pillar operated on him with a  laparoscope, he was then put in a drain.  Currently, he is convulsing in  the ICU.   Long-term medical problems include tobacco use disorder from 1961 to the  present, hypertension, COPD, schizophrenia now.  Further diagnoses of  schizoaffective disorder with a bipolar type, diabetes, moderate mental  retardation, obesity, hypercholesterolemia.   CURRENT MEDICATIONS:  1. ECASA 81 mg daily.  2. Zyprexa 20 mg daily.  3. Divalproex ER 500 mg 2 nightly.  4. Lorazepam 2 mg as much as q.i.d. p.r.n. anxiety.  5. Simvastatin 40 mg daily.  6. Oxybutynin ER 10 mg daily.  7. Enalapril 10 mg daily.   SOCIAL HISTORY:  He lives in [place], taken care by his brother.   OBJECTIVE:  He is currently sitting up in a chair.  Pulse is 100,  respiratory rate 20.  He is speaking to me, but I cannot understand his  speech.  He is on O2 by mask.  His lungs appear clear throughout moving  air well.  No intercostal retractions.  No use of accessory muscles of  respiration.  Heart is a regular rhythm, rate of about 70.  The abdomen  is somewhat tender after surgery.  There is no edema of the ankles.   ASSESSMENT:  1. Acute appendicitis with rupture status post surgery.  2. Schizoaffective disorder, bipolar type.  3. Moderate mental retardation.  4. Tobacco use.  5. Chronic obstructive pulmonary disease.  6. Benign essential hypertension.  7. Type 2 diabetes.  8. Obesity.  9. Hypercholesterolemia.   PLAN:  He is currently on the  same medication that he is on at home.  We  will watch his speech problems, which may be still secondary to the  effect of the anesthesia.  I have reviewed with nursing every medicine  he is on and he  is on p.r.n. lorazepam.  We will look to see how his speech does in the  next day or two.  Everything else appears to be stable at this point.  He has still got a Foley in and the oxygen, hopefully both will be  discontinued in the near future.  He should be able to be transferred  from the ICU to regular floor.      Mila Homer. Sudie Bailey, M.D.  Electronically Signed     SDK/MEDQ  D:  01/30/2009  T:  01/31/2009  Job:  [id number]

## 2010-11-30 NOTE — Group Therapy Note (Signed)
NAMEBRAYAM, BOEKE                 ACCOUNT NO.:  [id number]   MEDICAL RECORD NO.:  [id number]          PATIENT TYPE:  INP   LOCATION:  A341                          FACILITY:  [facility]   PHYSICIAN:  Mila Homer. Sudie Bailey, M.D.DATE OF BIRTH:  05-Jun-1941   DATE OF PROCEDURE:  DATE OF DISCHARGE:                                 PROGRESS NOTE   SUBJECTIVE:  The patient continues with groaning and moaning according  to his brother, but he is not talking today.   OBJECTIVE:  VITAL SIGNS:  Temperature is 99.4, pulse 96, respiratory  rate 18, and blood pressure 145/75.  GENERAL:  He is supine in bed.  No acute distress, well developed, and  obese.  HEART:  Regular rhythm, rate of 90.  LUNGS:  Clear throughout, but decreased breath sounds.  ABDOMEN:  Soft without organomegaly or mass or distinct tenderness  today.   White cell count 17,100 with 82% neutrophils, 9 lymphs.  Hemoglobin  10.6, platelet count 422,000.  BMP normal except for glucose 109 and  albumin slow at 1.5.   ASSESSMENT:  1. Sepsis secondary to Bacteroides fragilis.  2. Status post rupture of appendix.  3. Chronic obstructive pulmonary disease.  4. Tobacco use disorder.  5. Persistent difficulty in speech.   PLAN:  I discussed with his brother.  Sometimes, his speech is quite  good according to him, other times he gets very garbled.  The patient is  due for transfer down to [facility] today for Interventional Radiology  to tap at least one of his fluid collections in his abdomen.  Meanwhile,  continue IV antibiotics and fluids.      Mila Homer. Sudie Bailey, M.D.  Electronically Signed     SDK/MEDQ  D:  02/04/2009  T:  02/04/2009  Job:  [id number]

## 2010-11-30 NOTE — Discharge Summary (Signed)
NAMEANTERO, DEROSIA                 ACCOUNT NO.:  [id number]   MEDICAL RECORD NO.:  [id number]          PATIENT TYPE:  INP   LOCATION:  A217                          FACILITY:  [facility]   PHYSICIAN:  Mila Homer. Sudie Bailey, M.D.DATE OF BIRTH:  10/18/1940   DATE OF ADMISSION:  11/10/2006  DATE OF DISCHARGE:  LH                               DISCHARGE SUMMARY   ADDENDUM   FINAL DISCHARGE DIAGNOSES:  1. GI bleed.  2. Mallory-Weiss tear.  3. Anemia secondary to the GI bleed.  4. Schizophrenia.      Mila Homer. Sudie Bailey, M.D.  Electronically Signed     SDK/MEDQ  D:  11/14/2006  T:  11/14/2006  Job:  [id number]

## 2010-11-30 NOTE — Discharge Summary (Signed)
NAMEDYER, KLUG                 ACCOUNT NO.:  [id number]   MEDICAL RECORD NO.:  [id number]          PATIENT TYPE:  INP   LOCATION:  A217                          FACILITY:  [facility]   PHYSICIAN:  Mila Homer. Sudie Bailey, M.D.DATE OF BIRTH:  10/18/1940   DATE OF ADMISSION:  11/10/2006  DATE OF DISCHARGE:  04/29/2008LH                               DISCHARGE SUMMARY   HISTORY OF PRESENT ILLNESS:  This 70 year old was admitted with a GI  bleed on November 10, 2006.  He had a benign 5-day hospitalization  extending from that day until November 14, 2006.  The vital signs remained  stable.  His admission white cell count 17,000 which 88% were  neutrophils, 11 lymphs.  H&H 13.8 and 40.3.  CMP was normal except for  glucose of 227.  Valproic acid level was 30.  Recheck H&H was 12.9 and  36.8, and then 12.3 and 36.1.  His white cell count dropped to 14,300  and it went up to 19,800.  H&H later that day was 10.9 and 31.8, and on  hospital day #2, BMP showed a glucose 105.  T4 and TSH were normal, A1c  6.4%, and recheck H&H is for stable at 10.0/28.6 and finally 10.6/30.3.   Admission chest x-ray showed mild vascular congestion, interstitial  prominence bibasilar atelectasis and borderline heart size.   He was admitted to the hospital, given IV normal saline 100 cc an hour,  Zofran 4 mg IV q.6 h p.r.n., Dilaudid 1 mg IV q. 46 hours for pain and  kept on Depakote ER 1000 mg q.h.s., Detrol LA 4 mg daily, enalapril 10  mg daily, Zyprexa 20 mg q.h.s.  He was given Protonix 40 mg IV daily.  IV fluids changed to D5 1/2-normal saline with 20 mg KCl per liter.  Accu-Chek's a.c. and h.s.   He had EGD through Dr. Jena Gauss, gastroenterologist.  This showed a Clayborne Artist tear, was treated with injection and two clips.  He was put on  Protonix 40 mg b.i.d. and Carafate 1 gram slurry q.i.d.   Did well with this regimen.  Gradually improved.  Strength returned,  able to stand up without feeling dizzy.  He was ready  for discharge home  hospital day #5.   DISCHARGE MEDICATIONS:  1. Depakote 1000 mg q.h.s.  2. Detrol LA 4 mg daily.  3. Enalapril 10 mg daily.  4. Lorazepam 2 mg q.4 h p.r.n.  5. Zyprexa 20 mg q.h.s.  6. Protonix 40 mg b.i.d. (60 with 2 refills).  7. Carafate slurry 1 gram q.i.d. for 2 weeks.   FOLLOWUP:  Followup in the office with [place] as scheduled within the next  several months and with Dr. Jena Gauss, his gastroenterologist, in the near  future.  He will have an elective EGD within a couple of weeks.      Mila Homer. Sudie Bailey, M.D.  Electronically Signed     SDK/MEDQ  D:  11/14/2006  T:  11/14/2006  Job:  [id number]

## 2010-11-30 NOTE — Op Note (Signed)
NAMEPASCO, MARCHITTO                 ACCOUNT NO.:  [id number]   MEDICAL RECORD NO.:  [id number]          PATIENT TYPE:  INP   LOCATION:  IC04                          FACILITY:  [facility]   PHYSICIAN:  Tilford Pillar, MD      DATE OF BIRTH:  27-Apr-1941   DATE OF PROCEDURE:  01/28/2009  DATE OF DISCHARGE:                               OPERATIVE REPORT   PREOPERATIVE DIAGNOSIS:  Acute appendicitis.   POSTOPERATIVE DIAGNOSIS:  Acute ruptured appendicitis.   PROCEDURE:  Laparoscopic appendectomy and intra-abdominal drain  placement.   SURGEON:  Tilford Pillar, MD   ANESTHESIA:  General endotracheal, local anesthetic 0.5% Sensorcaine  plain.   SPECIMEN:  Appendix.   ESTIMATED BLOOD LOSS:  Less than 150 mL.   INDICATIONS:  The patient is a 70 year old male who presented to [facility] Department with approximately 3 days of abdominal pain.  He was evaluated and worked up extensively by the emergency room  physician and was discovered to have findings consistent with acute  appendicitis.  CT scan was obtained without contrast demonstrating  dilatation of the appendix and small amount of peritoneal fluid.  Surgical consultation was obtained at this time.  On evaluation, the  patient did not have diffuse peritoneal signs, he had pain limited to  the right lower quadrant, symptoms consistent with  appendicitis.  The  risks, benefits, and alternatives of laparoscopic possible open  appendectomy were discussed at length with the patient including the  risk of bleeding, infection, small bowel injury, appendiceal stump leak  as well as the possibility of intraoperative and postoperative  infections including wound infection and intra-abdominal abscess  formations.  The patient's questions and concerns were addressed.  The  patient was consented for the planned procedure.   OPERATION:  The patient was taken to the operating room and was placed  in the supine position on the operating  room table, at which time the  general anesthetic administered.  Once the patient was asleep, he was  endotracheally intubated by Anesthesia.  At this time, a Foley catheter  had already been placed in the emergency department.  The patient's  abdomen was prepped with DuraPrep solution and draped in standard  fashion.  An infraumbilical stab incision was created as the patient did  have a supraumbilical ventral hernia.  This was noted as preoperatively  in the emergency department and was confirmed by the patient who was  being been chronically incarcerated without any difficulties.  At this  time, a Kocher clamp was utilized to dissect down through the  subcuticular tissue to grasp the anterior abdominal fascia.  A Veress  needle was inserted.  Saline drop test was utilized to confirm  intraperitoneal placement and then pneumoperitoneum was initiated.  Once  sufficient pneumoperitoneum was obtained, an 11-mm trocar was inserted  over a laparoscope allowing visualization of the trocar entering into  the peritoneal cavity.  At this time, the inner cannula was removed.  The laparoscope was reinserted.  There was no evidence of any trocar or  Veress needle placement injury.  Evaluation of  the abdominal cavity did  demonstrate minimal to adequate room for proceeding with laparoscopic  approach.  __________ had adequate room to place the remaining trocars.  Inspection superiorly did demonstrate the incarcerated hernia with a  large open defect.  The anterior abdominal wall finally appeared to be  transverse colon and omentum within the hernia sac.  An anterior  inspection down towards the pelvis, there was noted to be significant  amount of purulent fluid and turbid ascitic fluid.  I did place the  remaining trocars with an 11-mm trocar in the left lateral abdominal  wall and a 5-mm trocar in the suprapubic region.  These were placed  under direct visualization.  At this time, I did use a  suction irrigator  to aspirate the turbid fluid and I did start the irrigation process at  this time irrigating the pelvis.  The patient was then repositioned into  a reverse Trendelenburg left lateral decubitus position.  Inflammatory  phlegmon in the right lower quadrant at the paracecal  area was bluntly  removed off the pelvic and right lateral abdominal wall.  The cecum was  identified.  A significant amount inflammatory changes around the area.  Minimal dissection was required in the colon, was able to be mobilized  closely via blunt dissection of the right lateral abdominal wall due to  the inflamed nature of the tissue in this area.  With continued  dissection, I was able to identify the appendix, it was clearly the  source of the intra-abdominal contamination.  There was noted to be a  perforation near the tip of the appendix.  Additional purulent material  was noted to be exiting from careful manipulation.  I was able to  elevate the appendix and was able to visualize the base of the appendix  with __________ of the cecum.  The bowel around it did appear inflamed.  Inflammation appeared less when towards the tip of the appendix.  I can  feel that the staple line would be in __________ ligation of the  process.  I did create a window between the mesoappendix and base of the  appendix which was free and an Endo-GIA reticulating 45-mm stapler was  brought into the field and was placed through the infraumbilical trocar  site and with a regular load utilized to divide the base of the  appendix, a reload avascular firing was then utilized to divide the  mesoappendix and 2 separate firings were required.  Additional reload in  order to clearly divide the mesoappendix.  At this point, the appendix  was free.  It was placed through an EndoCatch bag, which was placed  along the right lateral abdominal wall.  Inspection of the appendiceal  was stopped.  Demonstrated excellent staple line  with ligation of the  lumen.  Additionally, the mesoappendix had excellent hemostasis.  At  this point, I copiously irrigated the field.  Repositioned the patient  to supine position and continued to irrigate until the returning  aspirate  appeared clear.  I did irrigate up along the right lateral  abdominal wall additionally around the liver and again with the pelvis  and via the infraumbilical trocar site, a 10-flat JP drain was brought  to the field.  The tail of the drain was pulled through the suprapubic  trocar site.  The drain was positioned into the pelvis along the right  pelvic brim and up along the right paracolic gutter.  This was lying  near the staple line  but was carefully positioned as not to lie on top  of the appendiceal stump.  I then brought the omentum down to cover the  area.  __________   At this time, the appendix was retrieved, it was removed through the  infraumbilical trocar site.  Additional sharp dissection and blunt  dissection was used to dilate the site large enough to adequately remove  the EndoCatch bag.  Upon removal, appendix was removed and an intact  EndoCatch bag was placed in the back table and sent as permanent  specimen to Pathology.  At this time, the pneumoperitoneum was  evacuated.  The trocar sites were irrigated and a 2-0 Vicryl suture was  utilized to reapproximate the supraumbilical hernia from infraumbilical  trocar site.  Local anesthetic was instilled.  Skin staples were  utilized to reapproximate the skin edges at both 11-mm trocar sites and  a 3-0 nylon suture was utilized to secure the drain in the suprapubic  site.  Sterile dressing to the abdominal wall was washed and dried with  a moist dry towel.  Sterile dressing was placed.  The drapes were  removed.  Dressings were secured.  The patient was allowed to come out of general  anesthetic.  He was transferred to the intensive care unit for recovery  as well as initial 24-hour  observation.  The patient tolerated the  procedure extremely well.      Tilford Pillar, MD  Electronically Signed     BZ/MEDQ  D:  01/29/2009  T:  01/29/2009  Job:  [id number]   cc:   Mila Homer. Sudie Bailey, M.D.  Fax: [phone]

## 2010-11-30 NOTE — H&P (Signed)
NAMERHIAN, FUNARI                 ACCOUNT NO.:  [id number]   MEDICAL RECORD NO.:  [id number]          PATIENT TYPE:  AMB   LOCATION:  DAY                           FACILITY:  [facility]   PHYSICIAN:  R. Roetta Sessions, M.D. DATE OF BIRTH:  10/18/1940   DATE OF ADMISSION:  DATE OF DISCHARGE:  LH                              HISTORY & PHYSICAL   CHIEF COMPLAINT:  Recent upper GI bleed, follow up EGD and screening  colonoscopy.   Roger Frye is a very pleasant 70 year old gentleman who developed  vertiginous symptoms last month with associated violent nausea and  vomiting with heaving which produced hematemesis.  He was admitted to  [facility] back in April.  We saw him in consultation on November 10, 2006.  He had significant hematemesis and dropped his hemoglobin  down to the point of needing transfusion.  He underwent an emergency EGD  by me on November 10, 2007 which demonstrated mucosal excavation at the EG  junction consistent with a Mallory-Weiss tear status post injection and  clipping therapy.  The stomach was full of blood and consequently  incompletely seen.  He was noted to have a patent pylorus, submucosal  nodule in the duodenum of uncertain significance.  We noted that he has  never had a colonoscopy.  He has done well, and his vertiginous symptoms  and secondarily his nausea and vomiting have resolved.  He has not had  any melena or rectal bleeding.  His appetite has been good.  He has  never had his lower GI tract imaged.  There is no family history of GI  neoplasia.   PAST MEDICAL HISTORY:  1. Hypertension.  2. History of ventral hernia.  3. History of stroke one year ago with minimal right upper extremity      weakness.  4. History of a seizure disorder but no seizure in 35 years.   HOME MEDICATIONS AT THE TIME OF ADMISSION (DO NOT HAVE A CURRENT LIST AT  THIS TIME):  Depakote, Detrol LA, lorazepam, Zyprexa, ASA 81 mg daily,  Ex-Lax p.r.n.   ALLERGIES:  NO  KNOWN DRUG ALLERGIES.   PAST SURGICAL HISTORY:  None.   FAMILY HISTORY:  Negative for chronic GI or liver illness.   SOCIAL HISTORY:  The patient smokes a pack of cigarettes daily.  No  alcohol or drug use.  His brother who is a Optician, dispensing helps take care of  him.   REVIEW OF SYSTEMS:  Has not had any chest pain, dyspnea on exertion, or  weight loss recently.   PHYSICAL EXAMINATION:  GENERAL:  Pleasant 70 year old gentleman  accompanied by this brother.  VITAL SIGNS:  Weight 234, height 5 feet 9 inches.  Temperature 98.7,  blood pressure 122/82, pulse 54.  SKIN:  Warm and dry.  There is no jaundice.  HEENT:  No scleral icterus.  NECK:  JVD is not prominent.  CHEST:  Lungs are clear to auscultation.  CARDIAC:  Regular rate and rhythm without murmurs, gallops, or rubs.  ABDOMEN:  Nondistended, positive bowel sounds, soft, nontender, without  appreciable mass or organomegaly.  EXTREMITIES:  No edema.  RECTAL:  Deferred to time of colonoscopy.   IMPRESSION:  Roger Frye is a very pleasant 70 year old gentleman  who presented to [facility] last month with what appeared to be  a significant upper gastrointestinal bleeding secondary to a Mallory-  Weiss tear which required therapeutic intervention.  His entire upper  gastrointestinal tract was not visualized.  He did have a submucosal  duodenal nodule of uncertain significance.  He has never had his lower  gastrointestinal tract imaged.   RECOMMENDATIONS:  I told Mr. Brandl that he ought to go ahead and have a  complete exam of his upper GI tract since that has not yet been done,  and I strongly urged him to go ahead and have a screening colonoscopy at  the same time, as this too has never been done.  The potential risks,  benefits, and alternatives have been reviewed.  Questions were answered.  All parties are agreeable.  We will plan to perform EGD and colonoscopy  in the very near future.  Will await further  recommendations after  endoscopic evaluation has been performed.      [name], M.D.  Electronically Signed     RMR/MEDQ  D:  11/28/2006  T:  11/28/2006  Job:  [id number]   cc:   Mila Homer. Sudie Bailey, M.D.  Fax: [phone]

## 2010-11-30 NOTE — Group Therapy Note (Signed)
NAMEGAVINN, COLLARD                 ACCOUNT NO.:  [id number]   MEDICAL RECORD NO.:  [id number]          PATIENT TYPE:  INP   LOCATION:  A341                          FACILITY:  [facility]   PHYSICIAN:  Mila Homer. Sudie Bailey, M.D.DATE OF BIRTH:  [birth year]   DATE OF PROCEDURE:  DATE OF DISCHARGE:                                 PROGRESS NOTE   SUBJECTIVE:  He is feeling much better today.   OBJECTIVE:  He is talking almost normally today.  His temperature is  97.3, pulse 71, respiratory rate 18, blood pressure 137/70.  He is  supine in bed.  No acute distress.  Well developed and obese.  His  brother is in attendance.  Lungs show decreased breath sounds  throughout.  He is moving air well.  There are no intercostal  retractions.  No use of accessory muscles of respiration.  The heart is  a regular rhythm, rate of 70.  Abdomen is soft and slightly distended  with some mild tenderness to palpation in the lower abdomen.   Blood cultures x2 were negative at 2 days.   ASSESSMENT:  1. Bacillus fragilis septicemia.  2. Status post rupture of appendix with surgery for same and then      drain for a right gutter fluid collection.  3. Chronic obstructive pulmonary disease.   PLAN:  Continue IV antibiotics and fluids.  Recheck a CBC and BMP in the  morning.  I talked to family about nursing home placed at least short  term until he is finally convalescent from this.      Mila Homer. Sudie Bailey, M.D.  Electronically Signed     SDK/MEDQ  D:  02/10/2009  T:  02/10/2009  Job:  [id number]

## 2010-11-30 NOTE — Group Therapy Note (Signed)
Roger Frye, Roger Frye                 ACCOUNT NO.:  [id number]   MEDICAL RECORD NO.:  [id number]          PATIENT TYPE:  INP   LOCATION:  A341                          FACILITY:  [facility]   PHYSICIAN:  Mila Homer. Sudie Bailey, M.D.DATE OF BIRTH:  03-06-1941   DATE OF PROCEDURE:  DATE OF DISCHARGE:  02/11/2009                                 PROGRESS NOTE   SUBJECTIVE:  Generally, he is doing better and improving.  Arrangements  are being made for him to be transferred to [facility] today.   OBJECTIVE:  VITAL SIGNS:  Temperature is 98.9, pulse 94, respiratory  rate 18, blood pressure 142/81.  GENERAL:  He is supine in bed, no acute distress, well developed and  obese.  His brother is in attendance.  Speech is understandable.  LUNGS:  Decreased breath sounds throughout, but he is moving air well.  SKIN:  Color is good.  HEART:  Regular rhythm rate of about 80.  ABDOMEN:  Soft without tenderness today and palpation throughout the  abdomen.  Still is a Foley catheter placed and Jackson-Pratt drain from  his right pericolic gutter.   LABORATORY FINDINGS:  Today, his white cell count is 12,200, hemoglobin  9.8 and MET-7 normal.  O2 sats 92% on 3 L.   ASSESSMENT:  1. Bacterial septicemia.  2. Status post ruptured appendix.  3. Chronic obstructive pulmonary disease.   PLAN:  We will continue him on his Foley catheter and his drain.  He  will be discharged to [facility] today as per Dr. Leticia Penna.  I will be  following him at [facility] and manage his other problems including  schizophrenia there.  Follow up with Dr. Leticia Penna in a week.  Continue  antibiotics as per Dr. Leticia Penna.      Mila Homer. Sudie Bailey, M.D.  Electronically Signed     SDK/MEDQ  D:  02/11/2009  T:  02/11/2009  Job:  [id number]

## 2010-12-03 NOTE — Group Therapy Note (Signed)
NAMEMATAS, BURROWS                 ACCOUNT NO.:  [id number]   MEDICAL RECORD NO.:  [id number]          PATIENT TYPE:  INP   LOCATION:  A217                          FACILITY:  [facility]   PHYSICIAN:  Mila Homer. Sudie Bailey, M.D.DATE OF BIRTH:  10/18/1940   DATE OF PROCEDURE:  DATE OF DISCHARGE:                                 PROGRESS NOTE   SUBJECTIVE:  He recently had to be admitted with a GI bleed.  He felt  very dizzy, spinning.  He had it just before he started throwing up  blood.  Since then he has been hospitalized.  He had an EGD and clips  put on a Mallory-Weiss tear.  He is generally feeling better but still  feels a little bit dizzy if he stands up.   OBJECTIVE:  Temperature 97.3, pulse 74, respiratory rate 20, blood  pressure 131/79.  He is currently in bed, but then goes and sits up and  stands up without falling.  His heart has a regular rhythm, rate of 100.  The lungs are clear throughout.  There is no edema in the ankles.  __________ is noted.  Today his hemoglobin and hematocrit is 10.6 and  30.3, up from 10/28.6 yesterday.  Glucoses have been 118, 124, 149 and  O2 SAT is 95% on room air.   ASSESSMENT:  1. Dizziness secondary to orthostatic hypotension from anemia.  2. Anemia secondary to a GI bleed.  3. Mallory-Weiss tear.  4. Schizophrenia.   PLAN:  He is to be actively ambulating with a wheel walker starting now.  His brother will be in attendance for his physical therapy.  We will  work with him as well, and if he is doing fine getting up without  dizziness tomorrow, he should be able to be discharged at that time.      Mila Homer. Sudie Bailey, M.D.  Electronically Signed     SDK/MEDQ  D:  11/13/2006  T:  11/13/2006  Job:  [id number]

## 2010-12-03 NOTE — Op Note (Signed)
Roger Frye, Roger Frye                 ACCOUNT NO.:  [id number]   MEDICAL RECORD NO.:  [id number]          PATIENT TYPE:  INP   LOCATION:  A217                          FACILITY:  [facility]   PHYSICIAN:  R. Roetta Sessions, M.D. DATE OF BIRTH:  10/18/1940   DATE OF PROCEDURE:  11/10/2006  DATE OF DISCHARGE:                               OPERATIVE REPORT   PROCEDURE:  EGD with bleeding control therapy.   INDICATIONS FOR PROCEDURE:  Patient is a 70 year old gentleman admitted  to the hospital with vertiginous symptoms, nausea, vomiting and  hematemesis.  EGD is now being done.  This approach has been discussed  with the patient and the patient's brother at length.  Potential risks,  benefits and alternatives have been reviewed, questions answered, he is  agreeable.  Please see documentation on the medical record.   PROCEDURE NOTE:  O2 saturation, blood pressure, pulse and respirations  monitored throughout the entire procedure.   CONSCIOUS SEDATION:  Versed 2 mg IV, Demerol 25 mg IV, Cetacaine spray  for topical pharyngeal anesthesia.   INSTRUMENT:  Pentax video chip system.   FINDINGS:  Examination of tubular esophagus revealed fresh blood  refluxing from the EG junction, esophageal mucosa was well seen down to  the EG junction where there was a 1.1 cm area of mucosal excavation  oozing fresh blood.  Please see photos.  The EG junction was easily  traversed.   Stomach:  The gastric cavity was full of blood and clot, was only  partially seen.  I was able to see the pylorus and antrum well.  This  segment appeared normal.  The pylorus was easily traversed.  Examination  of bulb and second portion demonstrated elongated 0.5 x 2 cm submucosal  nodule coming from in the duodenal mucosa, please see photos, of  uncertain significance.  Scope was pulled back into the stomach and  retroflexion of the proximal stomach, esophagogastric junction again  demonstrated fresh blood oozing from the EG  junction.  The scope was  pulled en face back into the distal esophagus where 2 mL of 1:10,000  epinephrine was injected submucosally into the EG junction slow  bleeding.  Subsequently, two resolution clips were placed on the tear  with good hemostasis no further intervention was performed.  Again the  stomach was not completely seen.  The patient tolerated the procedure  well as reactive to endoscopy.   IMPRESSION:  Bleeding mucosal excavation of the EG junction consistent  with Mallory Weiss tear status post injection therapy and clipping,  stomach full of blood, stomach incompletely seen, patent pylorus,  submucosal nodule in duodenum of uncertain significance, otherwise D1  and D2 appeared normal.   The patient has had a significant upper gastrointestinal bleed.  I  suspect his hemoglobin will plummet further as he equilibrates.   RECOMMENDATIONS:  1. Will repeat a H&H at 1800 today.  2. Place him on CC&T antiemetic therapy. Would like to see episodes of      heaving and retching avoided if at all possible.  3. Increase Protonix to 40 mg orally b.i.d. (  q.12h.).  4. Add Carafate 1 gram slurries q.i.d.  5. Will need elective EGD and a couple of weeks.  6. Would also offer him a screening colonoscopy (as he has never had      one) at that time to complete examination of his upper GI tract.  7. No MRI until clips have known to have passed out of his body.  8. Will allow clear liquid diet.  9. Will type and cross him for 2 units of packed red blood cells as      needed.      Roger Frye, M.D.  Electronically Signed     RMR/MEDQ  D:  11/10/2006  T:  11/10/2006  Job:  [id number]

## 2010-12-03 NOTE — H&P (Signed)
Roger Frye, Roger Frye                 ACCOUNT NO.:  [id number]   MEDICAL RECORD NO.:  [id number]          PATIENT TYPE:  INP   LOCATION:  A217                          FACILITY:  [facility]   PHYSICIAN:  Tesfaye D. Felecia Shelling, MD   DATE OF BIRTH:  10/18/1940   DATE OF ADMISSION:  11/10/2006  DATE OF DISCHARGE:  LH                              HISTORY & PHYSICAL   CHIEF COMPLAINT:  Hematemesis.   HISTORY OF PRESENT ILLNESS:  This is a 70 year old male patient with a  history of hypertension, schizophrenia and seizure disorder who came to  the emergency room with the above complaints.  The patient was in his  usual state of health until yesterday when he suddenly developed  recurrent bloody vomiting.  He vomited blood about 4 times.  The patient  was brought to the emergency room where he was evaluated and was  admitted for further treatment.  The patient feels dizzy and weak.  He  has no abdominal pain, melena, or chest pain.  The patient was taking  aspirin, 3 tablets of 81 mg daily.  He has not been taking any other  NSAIDs.   REVIEW OF SYSTEMS:  No fever or chills, cough, palpitations.  No chest  pain, abdominal pain, dysuria, urgency, or frequency of urination.   PAST MEDICAL HISTORY:  1. Hypertension.  2. Seizure disorder.  3. Schizophrenia.   CURRENT MEDICATIONS:  1. Depakote 1000 mg at bedtime.  2. Detrol LA 4 mg daily.  3. Enalapril 10 mg p.o. daily.  4. Lorazepam 2 mg p.o. q.4h. p.r.n.  5. Zyprexa 20 mg at bedtime.   SOCIAL HISTORY:  The patient is single.  He lives with is brother.  The  patient smokes about one-half pack cigarettes a day.  No history of  alcohol or substance abuse.   PHYSICAL EXAMINATION:  GENERAL:  The patient is alert, awake, sick-  looking.  VITAL SIGNS:  Blood pressure 137/74, pulse 92, respiratory rate 20,  temperature 97.3 degrees Fahrenheit.  HEENT:  Pupils equal, round, reactive.  NECK:  Supple.  CHEST:  Decreased air entry, few rhonchi, no coarse  sounds, second air  sound heard.  CARDIOVASCULAR:  No murmur.  ABDOMEN:  Soft, bowel sounds positive, normal.  No hepatosplenomegaly.  The patient has periumbilical hernia which is small and easily  reducible.  EXTREMITIES:  No leg edema.   ADMISSION LABORATORIES:  WBC 17.0, hemoglobin 13.8, hematocrit 40.3,  platelets 329.  Sodium 136, potassium 4.0, chloride 98, carbon dioxide  28, glucose 227, BUN 10, creatinine 0.9.  Bilirubin 0.3, alkaline  phosphatase 167, AST 16, ALT 15, total protein 6.4, calcium 8.8.  Valproic acid 30.   ASSESSMENT:  1. Probably upper GI bleed.  Etiology not clear at this time.  2. History of seizure disorder, on treatment.  3. Hypertension.  4. Schizophrenia.  5. Hyperglycemia.   PLAN:  We will start the patient on proton pump inhibitor.  Will monitor  CBC.  We will keep the patient n.p.o. until he is evaluated by GI.  Will  continue his regular medications.  Also monitor his blood sugar.      Tesfaye D. Felecia Shelling, MD  Electronically Signed     TDF/MEDQ  D:  11/10/2006  T:  11/10/2006  Job:  [id number]

## 2010-12-03 NOTE — Consult Note (Signed)
Roger Frye, Roger Frye                 ACCOUNT NO.:  [id number]   MEDICAL RECORD NO.:  [id number]          PATIENT TYPE:  INP   LOCATION:  A217                          FACILITY:  [facility]   PHYSICIAN:  R. Roetta Sessions, M.D. DATE OF BIRTH:  10/18/1940   DATE OF CONSULTATION:  11/10/2006  DATE OF DISCHARGE:                                 CONSULTATION   REQUESTING PHYSICIAN:  Dr. John Giovanni.   REASON FOR CONSULTATION:  Hematemesis.   HISTORY OF PRESENT ILLNESS:  The patient is a 70 year old Caucasian  gentleman, a patient of Dr. John Giovanni, who is admitted with  recurrent hematemesis.  Yesterday, he developed acute-onset vertigo-type  symptoms associated with nausea and vomiting.  He denies any associated  abdominal pain.  He presented to the emergency department and was  evaluated and treated and sent home.  During the night, he developed  recurrent vomiting and had several episodes of bright red blood noted in  the emesis.  He described it as large volume.  He came back to the  emergency department for further evaluation.  He was subsequently  admitted to Dr. Wynona Neat service.  He denies any ongoing  reflux-type symptoms, heartburn, dysphagia or odynophagia, abdominal  pain, diarrhea, melena or rectal bleeding.  He does have chronic  constipation.  He takes Ex-Lax p.r.n.  Since admission, his hemoglobin  has dropped 1 g/dL in the last 3 hours; it is still at 12.9, however.  He also initially had a white count of 17,000.  He has never had an EGD  or colonoscopy.  He is agreeable to an outpatient colonoscopy sometime  during the summertime.   MEDICATIONS AT HOME:  1. Depakote 1000 mg nightly.  2. Detrol LA 4 mg daily.  3. Enalapril 10 mg daily.  4. Lorazepam 2 mg q.4 h. p.r.n.  5. Zyprexa 20 mg daily.  6. Aspirin; he take three 81-mg tablets every morning.  7. Ex-Lax p.r.n.   ALLERGIES:  No known drug allergies.   PAST MEDICAL HISTORY:  1. Hypertension.  2.  History of ventral hernia.  3. He had a stroke about 1 year ago and has some minimal right upper      extremity weakness.  4. History of seizure disorder, but no seizures in over 35 years.   PAST SURGICAL HISTORY:  He has never had any surgeries.   FAMILY HISTORY:  Negative for colorectal cancer or peptic ulcer disease.   SOCIAL HISTORY:  He smokes a pack of cigarettes daily, no alcohol use or  drug use.  His brother helps take care of him.   REVIEW OF SYSTEMS:  No chest pain, shortness of breath or palpitations.  No weight loss.  See HPI for GI.   PHYSICAL EXAMINATION:  VITAL SIGNS:  Temperature 97.5, pulse 96,  respirations 20, blood pressure 145/77, O2 SAT is 92% on room air.  Weight 105 kg, height 69 inches.  GENERAL:  A pleasant, obese Caucasian gentleman in no acute distress.  SKIN:  Warm and dry.  No jaundice.  HEENT:  Sclerae anicteric.  Oropharyngeal mucosa moist  and pink.  He  wears upper dentures.  No teeth on the bottom.  No erythema or exudate.  No lymphadenopathy.  CHEST:  Scattered expiratory wheezes throughout.  No rhonchi or rales.  CARDIAC:  Regular rate and rhythm.  No murmurs, rubs, or gallops.  ABDOMEN:  Obese, but symmetrical.  Positive bowel sounds.  Baseball-  size, easily reducible, nontender ventral hernia.  Abdomen is nontender.  No organomegaly or masses.  No abdominal bruits.  EXTREMITIES:  No edema.   LABORATORY DATA:  White count 17,000, hemoglobin was 13.8, down to 12.9,  platelets 329,000.  Sodium 136, potassium 4, BUN 10, creatinine 0.98,  glucose 227, total bilirubin 0.3, alkaline phosphatase 67, AST 16, ALT  15, albumin 3.5.  Valproic acid is low at 30.   RADIOLOGIC FINDINGS:  He had a chest x-ray yesterday which revealed a  borderline cardiomegaly, mild vascular congestion and interstitial  prominence with bibasilar atelectasis.   IMPRESSION:  The patient is a 70 year old gentleman with acute-onset  nausea and vomiting in the setting of  vertigo-type symptoms.  After  several episodes of vomiting, he developed hematemesis.  He describes it  as large-volume bright red blood.  I suspect we are dealing with a  Mallory-Weiss tear.  He has had a drop of his hemoglobin of 1 g/dL in  the last 3 hours, possibly partially due to hydration/dilution.  He  denies any symptoms suggestive of peptic ulcer disease or erosive  ulcerative esophagitis.   RECOMMENDATION:  1. Continue to monitor H&H as planned.  2. Agree with PPI.  3. Supportive measures to limit vomiting.  4. Workup of vertigo per attending MD.  5. EGD today.  6. Colonoscopy as an outpatient over the summertime.      Tana Coast, P.AJonathon Bellows, M.D.  Electronically Signed    LL/MEDQ  D:  11/10/2006  T:  11/10/2006  Job:  [id number]   cc:   Mila Homer. Sudie Bailey, M.D.  Fax: [phone]

## 2011-02-28 ENCOUNTER — Emergency Department (HOSPITAL_COMMUNITY): Payer: Medicare Other

## 2011-02-28 ENCOUNTER — Encounter: Payer: Self-pay | Admitting: *Deleted

## 2011-02-28 ENCOUNTER — Emergency Department (HOSPITAL_COMMUNITY)
Admission: EM | Admit: 2011-02-28 | Discharge: 2011-02-28 | Disposition: A | Discharge status: Home / Self Care | Payer: Medicare Other | Attending: Emergency Medicine | Admitting: Emergency Medicine

## 2011-02-28 DIAGNOSIS — E7800 Pure hypercholesterolemia, unspecified: Secondary | ICD-10-CM | POA: Insufficient documentation

## 2011-02-28 DIAGNOSIS — I10 Essential (primary) hypertension: Secondary | ICD-10-CM | POA: Insufficient documentation

## 2011-02-28 DIAGNOSIS — S82899A Other fracture of unspecified lower leg, initial encounter for closed fracture: Secondary | ICD-10-CM | POA: Insufficient documentation

## 2011-02-28 DIAGNOSIS — R296 Repeated falls: Secondary | ICD-10-CM | POA: Insufficient documentation

## 2011-02-28 DIAGNOSIS — Z87891 Personal history of nicotine dependence: Secondary | ICD-10-CM | POA: Insufficient documentation

## 2011-02-28 DIAGNOSIS — F209 Schizophrenia, unspecified: Secondary | ICD-10-CM | POA: Insufficient documentation

## 2011-02-28 HISTORY — DX: Schizophrenia, unspecified: F20.9

## 2011-02-28 HISTORY — DX: Essential (primary) hypertension: I10

## 2011-02-28 HISTORY — DX: Pure hypercholesterolemia, unspecified: E78.00

## 2011-02-28 NOTE — ED Notes (Signed)
Pt fell Saturday afternoon and twisted his right ankle. C/o pain and swelling.

## 2011-02-28 NOTE — ED Notes (Signed)
Swelling and bruising right ankle

## 2011-02-28 NOTE — ED Provider Notes (Signed)
History     CSN: [id number] Arrival date & time: 02/28/2011 12:10 PM  Chief Complaint  Patient presents with  . Ankle Injury   HPI Comments: Pt and family member report pt fell in a mud puddle 8/11. He has had pain and swelling of the right ankle since that time. He now has pain attempting to stand. He presents to ED for evaluation.  Patient is a 70 y.o. male presenting with lower extremity injury. The history is provided by the patient.  Ankle Injury This is a new problem. The current episode started in the past 7 days. The problem occurs constantly. The problem has been gradually worsening. Associated symptoms include arthralgias. Pertinent negatives include no abdominal pain, chest pain, coughing or neck pain. Associated symptoms comments: Lower ext swelling and bruising.. The symptoms are aggravated by standing and walking. He has tried nothing for the symptoms. The treatment provided no relief.    Past Medical History  Diagnosis Date  . Schizophrenia   . Hypertension   . High cholesterol     Past Surgical History  Procedure Date  . Appendectomy   . Esophagus surgery     History reviewed. No pertinent family history.  History  Substance Use Topics  . Smoking status: Former Games developer  . Smokeless tobacco: Not on file  . Alcohol Use: No      Review of Systems  Constitutional: Negative for activity change.       All ROS Neg except as noted in HPI  HENT: Negative for nosebleeds and neck pain.   Eyes: Negative for photophobia and discharge.  Respiratory: Negative for cough, shortness of breath and wheezing.   Cardiovascular: Negative for chest pain and palpitations.  Gastrointestinal: Negative for abdominal pain and blood in stool.  Genitourinary: Negative for dysuria, frequency and hematuria.  Musculoskeletal: Positive for arthralgias. Negative for back pain.  Skin: Negative.   Neurological: Negative for dizziness, seizures and speech difficulty.    Psychiatric/Behavioral: Negative for hallucinations and confusion.    Physical Exam  BP 103/61  Pulse 93  Temp(Src) 97.4 F (36.3 C) (Oral)  Resp 22  Ht 5\' 9"  (1.753 m)  Wt 220 lb (99.791 kg)  BMI 32.49 kg/m2  SpO2 96%  Physical Exam  Nursing note and vitals reviewed. Constitutional: He is oriented to person, place, and time. He appears well-developed and well-nourished.  Non-toxic appearance.  HENT:  Head: Normocephalic.  Right Ear: Tympanic membrane and external ear normal.  Left Ear: Tympanic membrane and external ear normal.  Eyes: EOM and lids are normal. Pupils are equal, round, and reactive to light.  Neck: Normal range of motion. Neck supple. Carotid bruit is not present.  Cardiovascular: Normal rate, regular rhythm, normal heart sounds, intact distal pulses and normal pulses.   Pulmonary/Chest: Breath sounds normal. No respiratory distress.  Abdominal: Soft. Bowel sounds are normal. There is no tenderness. There is no guarding.  Musculoskeletal: Normal range of motion.  Lymphadenopathy:       Head (right side): No submandibular adenopathy present.       Head (left side): No submandibular adenopathy present.    He has no cervical adenopathy.  Neurological: He is alert and oriented to person, place, and time. He has normal strength. No cranial nerve deficit or sensory deficit.  Skin: Skin is warm and dry.  Psychiatric: He has a normal mood and affect. His speech is normal.    ED Course  Procedures  MDM I have reviewed nursing notes, vital signs, and  all appropriate lab and imaging results for this patient.      Kathie Dike, [place] 02/28/11 1422

## 2011-03-31 NOTE — ED Provider Notes (Signed)
Medical screening examination/treatment/procedure(s) were performed by non-physician practitioner and as supervising physician I was immediately available for consultation/collaboration.  Nicholes Stairs, MD 03/31/11 [phone]

## 2013-09-19 ENCOUNTER — Encounter (HOSPITAL_COMMUNITY): Payer: Self-pay | Admitting: Pharmacy Technician

## 2013-09-30 ENCOUNTER — Encounter (HOSPITAL_COMMUNITY)
Admission: RE | Admit: 2013-09-30 | Discharge: 2013-09-30 | Disposition: A | Discharge status: Home / Self Care | Payer: Medicare Other | Source: Ambulatory Visit | Attending: Ophthalmology | Admitting: Ophthalmology

## 2013-09-30 ENCOUNTER — Encounter (HOSPITAL_COMMUNITY): Payer: Self-pay

## 2013-09-30 DIAGNOSIS — Z01810 Encounter for preprocedural cardiovascular examination: Secondary | ICD-10-CM | POA: Insufficient documentation

## 2013-09-30 DIAGNOSIS — H2510 Age-related nuclear cataract, unspecified eye: Secondary | ICD-10-CM | POA: Insufficient documentation

## 2013-09-30 DIAGNOSIS — I10 Essential (primary) hypertension: Secondary | ICD-10-CM | POA: Insufficient documentation

## 2013-09-30 DIAGNOSIS — Z01812 Encounter for preprocedural laboratory examination: Secondary | ICD-10-CM | POA: Insufficient documentation

## 2013-09-30 HISTORY — DX: Cerebral infarction, unspecified: I63.9

## 2013-09-30 HISTORY — DX: Gastro-esophageal reflux disease without esophagitis: K21.9

## 2013-09-30 LAB — BASIC METABOLIC PANEL
BUN: 13 mg/dL (ref 6–23)
CHLORIDE: 102 meq/L (ref 96–112)
CO2: 31 mEq/L (ref 19–32)
Calcium: 9.3 mg/dL (ref 8.4–10.5)
Creatinine, Ser: 1.15 mg/dL (ref 0.50–1.35)
GFR calc Af Amer: 72 mL/min — ABNORMAL LOW (ref >90)
GFR calc non Af Amer: 62 mL/min — ABNORMAL LOW (ref >90)
Glucose, Bld: 103 mg/dL — ABNORMAL HIGH (ref 70–99)
POTASSIUM: 4.5 meq/L (ref 3.7–5.3)
Sodium: 142 mEq/L (ref 137–147)

## 2013-09-30 LAB — HEMOGLOBIN AND HEMATOCRIT, BLOOD
HEMATOCRIT: 40.9 % (ref 39.0–52.0)
Hemoglobin: 13.5 g/dL (ref 13.0–17.0)

## 2013-09-30 NOTE — Patient Instructions (Signed)
Your procedure is scheduled on:  10/07/13  [facility]    [facility]  [facility].  Call this number if you have problems the morning of surgery: [phone]   Remember:   Do not eat or drink :After Midnight.    Take these medicines the morning of surgery with A SIP OF WATER: Enalapril, Depakote   Do not wear jewelry, make-up or nail polish.  Do not wear lotions, powders, or perfumes. You may wear deodorant.  Do not shave 48 hours prior to surgery.  Do not bring valuables to the hospital.  Contacts, dentures or bridgework may not be worn into surgery.  Patients discharged the day of surgery will not be allowed to drive home.  Name and phone number of your driver:    Please read over the following fact sheets that you were given: Pain Booklet, Surgical Site Infection Prevention, Anesthesia Post-op Instructions and Care and Recovery After Surgery  Cataract Surgery  A cataract is a clouding of the lens of the eye. When a lens becomes cloudy, vision is reduced based on the degree and nature of the clouding. Surgery may be needed to improve vision. Surgery removes the cloudy lens and usually replaces it with a substitute lens (intraocular lens, IOL). LET YOUR EYE DOCTOR KNOW ABOUT:  Allergies to food or medicine.   Medicines taken including herbs, eyedrops, over-the-counter medicines, and creams.   Use of steroids (by mouth or creams).   Previous problems with anesthetics or numbing medicine.   History of bleeding problems or blood clots.   Previous surgery.   Other health problems, including diabetes and kidney problems.   Possibility of pregnancy, if this applies.  RISKS AND COMPLICATIONS  Infection.   Inflammation of the eyeball (endophthalmitis) that can spread to both eyes (sympathetic ophthalmia).   Poor wound healing.   If an IOL is inserted, it can later fall out of proper position. This is very uncommon.   Clouding of the part of your eye that holds an IOL in place.  This is called an "after-cataract." These are uncommon, but easily treated.  BEFORE THE PROCEDURE  Do not eat or drink anything except small amounts of water for 8 to 12 before your surgery, or as directed by your caregiver.   Unless you are told otherwise, continue any eyedrops you have been prescribed.   Talk to your primary caregiver about all other medicines that you take (both prescription and non-prescription). In some cases, you may need to stop or change medicines near the time of your surgery. This is most important if you are taking blood-thinning medicine.Do not stop medicines unless you are told to do so.   Arrange for someone to drive you to and from the procedure.   Do not put contact lenses in either eye on the day of your surgery.  PROCEDURE There is more than one method for safely removing a cataract. Your doctor can explain the differences and help determine which is best for you. Phacoemulsification surgery is the most common form of cataract surgery.  An injection is given behind the eye or eyedrops are given to make this a painless procedure.   A small cut (incision) is made on the edge of the clear, dome-shaped surface that covers the front of the eye (cornea).   A tiny probe is painlessly inserted into the eye. This device gives off ultrasound waves that soften and break up the cloudy center of the lens. This makes it easier  for the cloudy lens to be removed by suction.   An IOL may be implanted.   The normal lens of the eye is covered by a clear capsule. Part of that capsule is intentionally left in the eye to support the IOL.   Your surgeon may or may not use stitches to close the incision.  There are other forms of cataract surgery that require a larger incision and stiches to close the eye. This approach is taken in cases where the doctor feels that the cataract cannot be easily removed using phacoemulsification. AFTER THE PROCEDURE  When an IOL is  implanted, it does not need care. It becomes a permanent part of your eye and cannot be seen or felt.   Your doctor will schedule follow-up exams to check on your progress.   Review your other medicines with your doctor to see which can be resumed after surgery.   Use eyedrops or take medicine as prescribed by your doctor.  Document Released: 06/23/2011 Document Reviewed: 06/20/2011 [facility] Patient Information 2012 [place], [place].  .Cataract Surgery Care After Refer to this sheet in the next few weeks. These instructions provide you with information on caring for yourself after your procedure. Your caregiver may also give you more specific instructions. Your treatment has been planned according to current medical practices, but problems sometimes occur. Call your caregiver if you have any problems or questions after your procedure.  HOME CARE INSTRUCTIONS   Avoid strenuous activities as directed by your caregiver.   Ask your caregiver when you can resume driving.   Use eyedrops or other medicines to help healing and control pressure inside your eye as directed by your caregiver.   Only take over-the-counter or prescription medicines for pain, discomfort, or fever as directed by your caregiver.   Do not to touch or rub your eyes.   You may be instructed to use a protective shield during the first few days and nights after surgery. If not, wear sunglasses to protect your eyes. This is to protect the eye from pressure or from being accidentally bumped.   Keep the area around your eye clean and dry. Avoid swimming or allowing water to hit you directly in the face while showering. Keep soap and shampoo out of your eyes.   Do not bend or lift heavy objects. Bending increases pressure in the eye. You can walk, climb stairs, and do light household chores.   Do not put a contact lens into the eye that had surgery until your caregiver says it is okay to do so.   Ask your doctor when you can  return to work. This will depend on the kind of work that you do. If you work in a dusty environment, you may be advised to wear protective eyewear for a period of time.   Ask your caregiver when it will be safe to engage in sexual activity.   Continue with your regular eye exams as directed by your caregiver.  What to expect:  It is normal to feel itching and mild discomfort for a few days after cataract surgery. Some fluid discharge is also common, and your eye may be sensitive to light and touch.   After 1 to 2 days, even moderate discomfort should disappear. In most cases, healing will take about 6 weeks.   If you received an intraocular lens (IOL), you may notice that colors are very bright or have a blue tinge. Also, if you have been in bright sunlight, everything may  appear reddish for a few hours. If you see these color tinges, it is because your lens is clear and no longer cloudy. Within a few months after receiving an IOL, these extra colors should go away. When you have healed, you will probably need new glasses.  SEEK MEDICAL CARE IF:   You have increased bruising around your eye.   You have discomfort not helped by medicine.  SEEK IMMEDIATE MEDICAL CARE IF:   You have a fever.   You have a worsening or sudden vision loss.   You have redness, swelling, or increasing pain in the eye.   You have a thick discharge from the eye that had surgery.  MAKE SURE YOU:  Understand these instructions.   Will watch your condition.   Will get help right away if you are not doing well or get worse.  Document Released: 01/21/2005 Document Revised: 06/23/2011 Document Reviewed: 02/25/2011 [facility] Patient Information 2012 [place].

## 2013-10-07 ENCOUNTER — Ambulatory Visit (HOSPITAL_COMMUNITY): Payer: Medicare Other | Admitting: Anesthesiology

## 2013-10-07 ENCOUNTER — Encounter (HOSPITAL_COMMUNITY): Payer: Self-pay | Admitting: *Deleted

## 2013-10-07 ENCOUNTER — Ambulatory Visit (HOSPITAL_COMMUNITY)
Admission: RE | Admit: 2013-10-07 | Discharge: 2013-10-07 | Disposition: A | Discharge status: Home / Self Care | Payer: Medicare Other | Source: Ambulatory Visit | Attending: Ophthalmology | Admitting: Ophthalmology

## 2013-10-07 ENCOUNTER — Encounter (HOSPITAL_COMMUNITY)
Admission: RE | Disposition: A | Discharge status: Home / Self Care | Payer: Self-pay | Source: Ambulatory Visit | Attending: Ophthalmology

## 2013-10-07 ENCOUNTER — Encounter (HOSPITAL_COMMUNITY): Payer: Medicare Other | Admitting: Anesthesiology

## 2013-10-07 DIAGNOSIS — H2510 Age-related nuclear cataract, unspecified eye: Secondary | ICD-10-CM | POA: Insufficient documentation

## 2013-10-07 DIAGNOSIS — I10 Essential (primary) hypertension: Secondary | ICD-10-CM | POA: Insufficient documentation

## 2013-10-07 DIAGNOSIS — F17200 Nicotine dependence, unspecified, uncomplicated: Secondary | ICD-10-CM | POA: Insufficient documentation

## 2013-10-07 DIAGNOSIS — Z7982 Long term (current) use of aspirin: Secondary | ICD-10-CM | POA: Insufficient documentation

## 2013-10-07 DIAGNOSIS — H2181 Floppy iris syndrome: Secondary | ICD-10-CM | POA: Diagnosis not present

## 2013-10-07 DIAGNOSIS — F209 Schizophrenia, unspecified: Secondary | ICD-10-CM | POA: Insufficient documentation

## 2013-10-07 DIAGNOSIS — Z79899 Other long term (current) drug therapy: Secondary | ICD-10-CM | POA: Insufficient documentation

## 2013-10-07 HISTORY — PX: CATARACT EXTRACTION W/PHACO: SHX586

## 2013-10-07 SURGERY — PHACOEMULSIFICATION, CATARACT, WITH IOL INSERTION
Anesthesia: Monitor Anesthesia Care | Site: Eye | Laterality: Right

## 2013-10-07 MED ORDER — FENTANYL CITRATE 0.05 MG/ML IJ SOLN
25.0000 ug | INTRAMUSCULAR | Status: AC
  Administered 2013-10-07 (×2): 25 ug via INTRAVENOUS

## 2013-10-07 MED ORDER — EPINEPHRINE HCL 1 MG/ML IJ SOLN
INTRAOCULAR | Status: DC | PRN
  Administered 2013-10-07: 08:00:00

## 2013-10-07 MED ORDER — MIDAZOLAM HCL 2 MG/2ML IJ SOLN
1.0000 mg | INTRAMUSCULAR | Status: DC | PRN
  Administered 2013-10-07: 2 mg via INTRAVENOUS

## 2013-10-07 MED ORDER — FENTANYL CITRATE 0.05 MG/ML IJ SOLN
INTRAMUSCULAR | Status: AC
  Filled 2013-10-07: qty 2

## 2013-10-07 MED ORDER — BSS IO SOLN
INTRAOCULAR | Status: DC | PRN
  Administered 2013-10-07: 15 mL via INTRAOCULAR

## 2013-10-07 MED ORDER — PROVISC 10 MG/ML IO SOLN
INTRAOCULAR | Status: DC | PRN
  Administered 2013-10-07: 0.85 mL via INTRAOCULAR

## 2013-10-07 MED ORDER — LIDOCAINE HCL (PF) 1 % IJ SOLN
INTRAOCULAR | Status: DC | PRN
  Administered 2013-10-07: 08:00:00

## 2013-10-07 MED ORDER — EPINEPHRINE HCL 1 MG/ML IJ SOLN
INTRAMUSCULAR | Status: AC
Start: 2013-10-07 — End: 2013-10-07
  Filled 2013-10-07: qty 1

## 2013-10-07 MED ORDER — POVIDONE-IODINE 5 % OP SOLN
OPHTHALMIC | Status: DC | PRN
  Administered 2013-10-07: 1 via OPHTHALMIC

## 2013-10-07 MED ORDER — PHENYLEPHRINE HCL 2.5 % OP SOLN
1.0000 [drp] | OPHTHALMIC | Status: AC
  Administered 2013-10-07 (×3): 1 [drp] via OPHTHALMIC

## 2013-10-07 MED ORDER — TETRACAINE HCL 0.5 % OP SOLN
1.0000 [drp] | OPHTHALMIC | Status: AC
  Administered 2013-10-07 (×3): 1 [drp] via OPHTHALMIC

## 2013-10-07 MED ORDER — LIDOCAINE 3.5 % OP GEL OPTIME - NO CHARGE
OPHTHALMIC | Status: DC | PRN
  Administered 2013-10-07: 2 [drp] via OPHTHALMIC

## 2013-10-07 MED ORDER — EPINEPHRINE HCL 1 MG/ML IJ SOLN
INTRAMUSCULAR | Status: AC
  Filled 2013-10-07: qty 1

## 2013-10-07 MED ORDER — CYCLOPENTOLATE-PHENYLEPHRINE 0.2-1 % OP SOLN
1.0000 [drp] | OPHTHALMIC | Status: AC
  Administered 2013-10-07 (×3): 1 [drp] via OPHTHALMIC

## 2013-10-07 MED ORDER — LACTATED RINGERS IV SOLN
INTRAVENOUS | Status: DC
  Administered 2013-10-07: 08:00:00 via INTRAVENOUS

## 2013-10-07 MED ORDER — MIDAZOLAM HCL 2 MG/2ML IJ SOLN
INTRAMUSCULAR | Status: AC
  Filled 2013-10-07: qty 2

## 2013-10-07 MED ORDER — LIDOCAINE HCL 3.5 % OP GEL
1.0000 "application " | Freq: Once | OPHTHALMIC | Status: AC
  Administered 2013-10-07: 1 via OPHTHALMIC

## 2013-10-07 MED ORDER — NEOMYCIN-POLYMYXIN-DEXAMETH 3.5-10000-0.1 OP SUSP
OPHTHALMIC | Status: DC | PRN
  Administered 2013-10-07: 2 [drp] via OPHTHALMIC

## 2013-10-07 SURGICAL SUPPLY — 34 items
CAPSULAR TENSION RING-AMO (OPHTHALMIC RELATED) IMPLANT
CLOTH BEACON ORANGE TIMEOUT ST (SAFETY) ×2 IMPLANT
EYE SHIELD UNIVERSAL CLEAR (GAUZE/BANDAGES/DRESSINGS) ×2 IMPLANT
GLOVE BIO SURGEON STRL SZ 6.5 (GLOVE) IMPLANT
GLOVE BIO SURGEONS STRL SZ 6.5 (GLOVE)
GLOVE BIOGEL PI IND STRL 6.5 (GLOVE) IMPLANT
GLOVE BIOGEL PI IND STRL 7.0 (GLOVE) IMPLANT
GLOVE BIOGEL PI IND STRL 7.5 (GLOVE) IMPLANT
GLOVE BIOGEL PI INDICATOR 6.5 (GLOVE) ×2
GLOVE BIOGEL PI INDICATOR 7.0 (GLOVE)
GLOVE BIOGEL PI INDICATOR 7.5 (GLOVE)
GLOVE ECLIPSE 6.5 STRL STRAW (GLOVE) IMPLANT
GLOVE ECLIPSE 7.0 STRL STRAW (GLOVE) IMPLANT
GLOVE ECLIPSE 7.5 STRL STRAW (GLOVE) IMPLANT
GLOVE EXAM NITRILE LRG STRL (GLOVE) IMPLANT
GLOVE EXAM NITRILE MD LF STRL (GLOVE) IMPLANT
GLOVE SKINSENSE NS SZ6.5 (GLOVE)
GLOVE SKINSENSE NS SZ7.0 (GLOVE)
GLOVE SKINSENSE STRL SZ6.5 (GLOVE) IMPLANT
GLOVE SKINSENSE STRL SZ7.0 (GLOVE) IMPLANT
GLOVE SS N UNI LF 7.0 STRL (GLOVE) ×2 IMPLANT
KIT VITRECTOMY (OPHTHALMIC RELATED) IMPLANT
PAD ARMBOARD 7.5X6 YLW CONV (MISCELLANEOUS) ×2 IMPLANT
PROC W NO LENS (INTRAOCULAR LENS)
PROC W SPEC LENS (INTRAOCULAR LENS)
PROCESS W NO LENS (INTRAOCULAR LENS) IMPLANT
PROCESS W SPEC LENS (INTRAOCULAR LENS) IMPLANT
RING MALYGIN (MISCELLANEOUS) IMPLANT
SIGHTPATH CAT PROC W REG LENS (Ophthalmic Related) ×3 IMPLANT
SYR TB 1ML LL NO SAFETY (SYRINGE) ×2 IMPLANT
TAPE SURG TRANSPORE 1 IN (GAUZE/BANDAGES/DRESSINGS) IMPLANT
TAPE SURGICAL TRANSPORE 1 IN (GAUZE/BANDAGES/DRESSINGS) ×2
VISCOELASTIC ADDITIONAL (OPHTHALMIC RELATED) IMPLANT
WATER STERILE IRR 250ML POUR (IV SOLUTION) ×2 IMPLANT

## 2013-10-07 NOTE — Anesthesia Postprocedure Evaluation (Signed)
  Anesthesia Post-op Note  Patient: Roger Frye  Procedure(s) Performed: Procedure(s) with comments: CATARACT EXTRACTION PHACO AND INTRAOCULAR LENS PLACEMENT (IOC) (Right) - CDE 19.32  Patient Location: Short Stay  Anesthesia Type:MAC  Level of Consciousness: awake, alert  and oriented  Airway and Oxygen Therapy: Patient Spontanous Breathing  Post-op Pain: none  Post-op Assessment: Post-op Vital signs reviewed, Patient's Cardiovascular Status Stable, Respiratory Function Stable, Patent Airway and No signs of Nausea or vomiting  Post-op Vital Signs: Reviewed and stable  Complications: No apparent anesthesia complications

## 2013-10-07 NOTE — Op Note (Signed)
Date of Admission: 10/07/2013  Date of Surgery: 10/07/2013  Pre-Op Dx: Cataract  Right  Eye  Post-Op Dx: Nuclear Cataract  Right  Eye,  Dx Code 366.16, Intraoperative Floppy Lesle ReekIris Syndrome,Right Eye, Dx Code: [phone]  Surgeon: Gemma PayorKerry Ceyda Peterka, M.D.  Assistants: None  Anesthesia: Topical with MAC  Indications: Painless, progressive loss of vision with compromise of daily activities.  Surgery: Cataract Extraction with Intraocular lens Implant Right Eye  Discription: The patient had dilating drops and viscous lidocaine placed into the right eye in the pre-op holding area. After transfer to the operating room, a time out was performed. The patient was then prepped and draped. Beginning with a 75 degree blade a paracentesis port was made at the surgeon's 2 o'clock position. The anterior chamber was then filled with 1% non-preserved lidocaine. This was followed by filling the anterior chamber with Provisc.  A 2.54mm keratome blade was used to make a clear corneal incision at the temporal limbus.  A bent cystatome needle was used to create a continuous tear capsulotomy. Hydrodissection was performed with balanced salt solution on a Fine canula. The lens nucleus was then removed using the phacoemulsification handpiece. Residual cortex was removed with the I&A handpiece. The anterior chamber and capsular bag were refilled with Provisc. A posterior chamber intraocular lens was placed into the capsular bag with it's injector. The implant was positioned with the Kuglan hook. The Provisc was then removed from the anterior chamber and capsular bag with the I&A handpiece. Stromal hydration of the main incision and paracentesis port was performed with BSS on a Fine canula. The wounds were tested for leak which was negative. The patient tolerated the procedure well. There were no operative complications. The patient was then transferred to the recovery room in stable condition.  Complications: None  Specimen:  None  EBL: None  Prosthetic device: B&L enVista, MX60, power 21.0D, SN [id number].

## 2013-10-07 NOTE — H&P (Signed)
I have reviewed the H&P, the patient was re-examined, and I have identified no interval changes in medical condition and plan of care since the history and physical of record  

## 2013-10-07 NOTE — Anesthesia Preprocedure Evaluation (Signed)
Anesthesia Evaluation  Patient identified by MRN, date of birth, ID band Patient awake    Reviewed: Allergy & Precautions, H&P , NPO status , Patient's Chart, lab work & pertinent test results  Airway Mallampati: II TM Distance: >3 FB     Dental  (+) Edentulous Upper, Edentulous Lower   Pulmonary Current Smoker,  breath sounds clear to auscultation        Cardiovascular hypertension, Pt. on medications Rhythm:Regular Rate:Normal     Neuro/Psych PSYCHIATRIC DISORDERS Schizophrenia CVA, Residual Symptoms    GI/Hepatic GERD-  Medicated,  Endo/Other    Renal/GU      Musculoskeletal   Abdominal   Peds  Hematology   Anesthesia Other Findings   Reproductive/Obstetrics                           Anesthesia Physical Anesthesia Plan  ASA: III  Anesthesia Plan: MAC   Post-op Pain Management:    Induction: Intravenous  Airway Management Planned: Nasal Cannula  Additional Equipment:   Intra-op Plan:   Post-operative Plan:   Informed Consent: I have reviewed the patients History and Physical, chart, labs and discussed the procedure including the risks, benefits and alternatives for the proposed anesthesia with the patient or authorized representative who has indicated his/her understanding and acceptance.     Plan Discussed with:   Anesthesia Plan Comments:         Anesthesia Quick Evaluation

## 2013-10-07 NOTE — Transfer of Care (Signed)
Immediate Anesthesia Transfer of Care Note  Patient: Roger Frye  Procedure(s) Performed: Procedure(s) with comments: CATARACT EXTRACTION PHACO AND INTRAOCULAR LENS PLACEMENT (IOC) (Right) - CDE 19.32  Patient Location: Short Stay  Anesthesia Type:MAC  Level of Consciousness: awake  Airway & Oxygen Therapy: Patient Spontanous Breathing  Post-op Assessment: Report given to PACU RN  Post vital signs: Reviewed  Complications: No apparent anesthesia complications

## 2013-10-08 ENCOUNTER — Encounter (HOSPITAL_COMMUNITY): Payer: Self-pay | Admitting: Ophthalmology

## 2014-11-11 ENCOUNTER — Emergency Department (HOSPITAL_COMMUNITY): Payer: Medicare Other

## 2014-11-11 ENCOUNTER — Inpatient Hospital Stay (HOSPITAL_COMMUNITY)
Admission: EM | Admit: 2014-11-11 | Discharge: 2014-11-15 | DRG: 871 | Disposition: A | Discharge status: Home / Self Care | Payer: Medicare Other | Attending: Internal Medicine | Admitting: Internal Medicine

## 2014-11-11 ENCOUNTER — Encounter (HOSPITAL_COMMUNITY): Payer: Self-pay | Admitting: Cardiology

## 2014-11-11 DIAGNOSIS — E669 Obesity, unspecified: Secondary | ICD-10-CM | POA: Diagnosis present

## 2014-11-11 DIAGNOSIS — Z8673 Personal history of transient ischemic attack (TIA), and cerebral infarction without residual deficits: Secondary | ICD-10-CM | POA: Diagnosis not present

## 2014-11-11 DIAGNOSIS — N179 Acute kidney failure, unspecified: Secondary | ICD-10-CM | POA: Diagnosis present

## 2014-11-11 DIAGNOSIS — I10 Essential (primary) hypertension: Secondary | ICD-10-CM | POA: Diagnosis present

## 2014-11-11 DIAGNOSIS — R0902 Hypoxemia: Secondary | ICD-10-CM

## 2014-11-11 DIAGNOSIS — F209 Schizophrenia, unspecified: Secondary | ICD-10-CM | POA: Diagnosis present

## 2014-11-11 DIAGNOSIS — B9689 Other specified bacterial agents as the cause of diseases classified elsewhere: Secondary | ICD-10-CM | POA: Diagnosis present

## 2014-11-11 DIAGNOSIS — R509 Fever, unspecified: Secondary | ICD-10-CM | POA: Diagnosis not present

## 2014-11-11 DIAGNOSIS — J189 Pneumonia, unspecified organism: Secondary | ICD-10-CM | POA: Diagnosis present

## 2014-11-11 DIAGNOSIS — R7881 Bacteremia: Secondary | ICD-10-CM | POA: Diagnosis present

## 2014-11-11 DIAGNOSIS — J209 Acute bronchitis, unspecified: Secondary | ICD-10-CM | POA: Diagnosis present

## 2014-11-11 DIAGNOSIS — T380X5A Adverse effect of glucocorticoids and synthetic analogues, initial encounter: Secondary | ICD-10-CM | POA: Diagnosis present

## 2014-11-11 DIAGNOSIS — J9601 Acute respiratory failure with hypoxia: Secondary | ICD-10-CM | POA: Diagnosis present

## 2014-11-11 DIAGNOSIS — E780 Pure hypercholesterolemia: Secondary | ICD-10-CM | POA: Diagnosis present

## 2014-11-11 DIAGNOSIS — Z7982 Long term (current) use of aspirin: Secondary | ICD-10-CM

## 2014-11-11 DIAGNOSIS — R0989 Other specified symptoms and signs involving the circulatory and respiratory systems: Secondary | ICD-10-CM

## 2014-11-11 DIAGNOSIS — F17210 Nicotine dependence, cigarettes, uncomplicated: Secondary | ICD-10-CM | POA: Diagnosis present

## 2014-11-11 DIAGNOSIS — A419 Sepsis, unspecified organism: Secondary | ICD-10-CM | POA: Diagnosis not present

## 2014-11-11 DIAGNOSIS — K219 Gastro-esophageal reflux disease without esophagitis: Secondary | ICD-10-CM | POA: Diagnosis present

## 2014-11-11 DIAGNOSIS — A770 Spotted fever due to Rickettsia rickettsii: Secondary | ICD-10-CM | POA: Diagnosis present

## 2014-11-11 DIAGNOSIS — R6510 Systemic inflammatory response syndrome (SIRS) of non-infectious origin without acute organ dysfunction: Secondary | ICD-10-CM

## 2014-11-11 DIAGNOSIS — E860 Dehydration: Secondary | ICD-10-CM | POA: Diagnosis present

## 2014-11-11 DIAGNOSIS — E785 Hyperlipidemia, unspecified: Secondary | ICD-10-CM | POA: Diagnosis present

## 2014-11-11 DIAGNOSIS — Z6831 Body mass index (BMI) 31.0-31.9, adult: Secondary | ICD-10-CM | POA: Diagnosis not present

## 2014-11-11 DIAGNOSIS — F17200 Nicotine dependence, unspecified, uncomplicated: Secondary | ICD-10-CM | POA: Diagnosis present

## 2014-11-11 DIAGNOSIS — R739 Hyperglycemia, unspecified: Secondary | ICD-10-CM | POA: Diagnosis present

## 2014-11-11 DIAGNOSIS — A938 Other specified arthropod-borne viral fevers: Secondary | ICD-10-CM | POA: Diagnosis not present

## 2014-11-11 HISTORY — DX: Other specified arthropod-borne viral fevers: A93.8

## 2014-11-11 HISTORY — DX: Pneumonia, unspecified organism: J18.9

## 2014-11-11 LAB — COMPREHENSIVE METABOLIC PANEL
ALBUMIN: 3.3 g/dL — AB (ref 3.5–5.2)
ALT: 15 U/L (ref 0–53)
AST: 19 U/L (ref 0–37)
Alkaline Phosphatase: 56 U/L (ref 39–117)
Anion gap: 8 (ref 5–15)
BUN: 24 mg/dL — AB (ref 6–23)
CALCIUM: 8.4 mg/dL (ref 8.4–10.5)
CO2: 26 mmol/L (ref 19–32)
Chloride: 104 mmol/L (ref 96–112)
Creatinine, Ser: 1.69 mg/dL — ABNORMAL HIGH (ref 0.50–1.35)
GFR calc non Af Amer: 39 mL/min — ABNORMAL LOW (ref >90)
GFR, EST AFRICAN AMERICAN: 45 mL/min — AB (ref >90)
Glucose, Bld: 130 mg/dL — ABNORMAL HIGH (ref 70–99)
Potassium: 4.4 mmol/L (ref 3.5–5.1)
Sodium: 138 mmol/L (ref 135–145)
Total Bilirubin: 0.3 mg/dL (ref 0.3–1.2)
Total Protein: 6.7 g/dL (ref 6.0–8.3)

## 2014-11-11 LAB — URINALYSIS, ROUTINE W REFLEX MICROSCOPIC
Bilirubin Urine: NEGATIVE
GLUCOSE, UA: NEGATIVE mg/dL
Hgb urine dipstick: NEGATIVE
KETONES UR: NEGATIVE mg/dL
LEUKOCYTES UA: NEGATIVE
Nitrite: NEGATIVE
PROTEIN: NEGATIVE mg/dL
Specific Gravity, Urine: 1.025 (ref 1.005–1.030)
Urobilinogen, UA: 0.2 mg/dL (ref 0.0–1.0)
pH: 8 (ref 5.0–8.0)

## 2014-11-11 LAB — CBC WITH DIFFERENTIAL/PLATELET
Basophils Absolute: 0.1 10*3/uL (ref 0.0–0.1)
Basophils Relative: 0 % (ref 0–1)
EOS ABS: 0.4 10*3/uL (ref 0.0–0.7)
Eosinophils Relative: 3 % (ref 0–5)
HEMATOCRIT: 40.4 % (ref 39.0–52.0)
Hemoglobin: 13.2 g/dL (ref 13.0–17.0)
LYMPHS PCT: 9 % — AB (ref 12–46)
Lymphs Abs: 1.2 10*3/uL (ref 0.7–4.0)
MCH: 30.6 pg (ref 26.0–34.0)
MCHC: 32.7 g/dL (ref 30.0–36.0)
MCV: 93.7 fL (ref 78.0–100.0)
MONO ABS: 1.5 10*3/uL — AB (ref 0.1–1.0)
MONOS PCT: 12 % (ref 3–12)
NEUTROS ABS: 9.9 10*3/uL — AB (ref 1.7–7.7)
NEUTROS PCT: 76 % (ref 43–77)
Platelets: 165 10*3/uL (ref 150–400)
RBC: 4.31 MIL/uL (ref 4.22–5.81)
RDW: 13.4 % (ref 11.5–15.5)
WBC: 13.1 10*3/uL — ABNORMAL HIGH (ref 4.0–10.5)

## 2014-11-11 LAB — INFLUENZA PANEL BY PCR (TYPE A & B)
H1N1FLUPCR: NOT DETECTED
INFLAPCR: NEGATIVE
Influenza B By PCR: NEGATIVE

## 2014-11-11 LAB — I-STAT CG4 LACTIC ACID, ED: LACTIC ACID, VENOUS: 1.63 mmol/L (ref 0.5–2.0)

## 2014-11-11 LAB — VALPROIC ACID LEVEL: Valproic Acid Lvl: 56.6 ug/mL (ref 50.0–100.0)

## 2014-11-11 MED ORDER — DIVALPROEX SODIUM ER 500 MG PO TB24
1000.0000 mg | ORAL_TABLET | Freq: Every day | ORAL | Status: DC
  Administered 2014-11-11 – 2014-11-14 (×4): 1000 mg via ORAL
  Filled 2014-11-11 (×4): qty 2

## 2014-11-11 MED ORDER — LORAZEPAM 1 MG PO TABS
2.0000 mg | ORAL_TABLET | Freq: Four times a day (QID) | ORAL | Status: DC | PRN
  Administered 2014-11-12: 2 mg via ORAL
  Filled 2014-11-11: qty 2

## 2014-11-11 MED ORDER — ASPIRIN EC 81 MG PO TBEC
81.0000 mg | DELAYED_RELEASE_TABLET | Freq: Every day | ORAL | Status: DC
  Administered 2014-11-12 – 2014-11-15 (×4): 81 mg via ORAL
  Filled 2014-11-11 (×4): qty 1

## 2014-11-11 MED ORDER — PIPERACILLIN-TAZOBACTAM 3.375 G IVPB 30 MIN
3.3750 g | Freq: Once | INTRAVENOUS | Status: AC
  Administered 2014-11-11: 3.375 g via INTRAVENOUS
  Filled 2014-11-11: qty 50

## 2014-11-11 MED ORDER — ONDANSETRON HCL 4 MG PO TABS: 4.0000 mg | ORAL_TABLET | Freq: Four times a day (QID) | ORAL | Status: DC | PRN

## 2014-11-11 MED ORDER — SODIUM CHLORIDE 0.9 % IV SOLN
INTRAVENOUS | Status: DC
  Administered 2014-11-12: via INTRAVENOUS

## 2014-11-11 MED ORDER — DARIFENACIN HYDROBROMIDE ER 7.5 MG PO TB24
7.5000 mg | ORAL_TABLET | Freq: Every day | ORAL | Status: DC
  Administered 2014-11-12 – 2014-11-15 (×4): 7.5 mg via ORAL
  Filled 2014-11-11 (×4): qty 1

## 2014-11-11 MED ORDER — ONDANSETRON HCL 4 MG/2ML IJ SOLN: 4.0000 mg | Freq: Four times a day (QID) | INTRAMUSCULAR | Status: DC | PRN

## 2014-11-11 MED ORDER — IPRATROPIUM-ALBUTEROL 0.5-2.5 (3) MG/3ML IN SOLN
3.0000 mL | Freq: Once | RESPIRATORY_TRACT | Status: AC
  Administered 2014-11-11: 3 mL via RESPIRATORY_TRACT
  Filled 2014-11-11: qty 3

## 2014-11-11 MED ORDER — MECLIZINE HCL 12.5 MG PO TABS
25.0000 mg | ORAL_TABLET | Freq: Two times a day (BID) | ORAL | Status: DC
  Administered 2014-11-12 – 2014-11-15 (×8): 25 mg via ORAL
  Filled 2014-11-11 (×8): qty 2

## 2014-11-11 MED ORDER — PIPERACILLIN-TAZOBACTAM 3.375 G IVPB
3.3750 g | Freq: Three times a day (TID) | INTRAVENOUS | Status: DC
  Filled 2014-11-11 (×2): qty 50

## 2014-11-11 MED ORDER — SIMVASTATIN 20 MG PO TABS
40.0000 mg | ORAL_TABLET | Freq: Every day | ORAL | Status: DC
  Administered 2014-11-11 – 2014-11-14 (×4): 40 mg via ORAL
  Filled 2014-11-11: qty 4
  Filled 2014-11-11 (×3): qty 2

## 2014-11-11 MED ORDER — VANCOMYCIN HCL IN DEXTROSE 750-5 MG/150ML-% IV SOLN
750.0000 mg | Freq: Two times a day (BID) | INTRAVENOUS | Status: DC
  Filled 2014-11-11 (×3): qty 150

## 2014-11-11 MED ORDER — OLANZAPINE 5 MG PO TABS
20.0000 mg | ORAL_TABLET | Freq: Every day | ORAL | Status: DC
  Administered 2014-11-11 – 2014-11-14 (×4): 20 mg via ORAL
  Filled 2014-11-11 (×4): qty 4

## 2014-11-11 MED ORDER — VANCOMYCIN HCL IN DEXTROSE 1-5 GM/200ML-% IV SOLN
1000.0000 mg | Freq: Once | INTRAVENOUS | Status: DC
  Administered 2014-11-11: 1000 mg via INTRAVENOUS

## 2014-11-11 MED ORDER — VANCOMYCIN HCL IN DEXTROSE 1-5 GM/200ML-% IV SOLN
1000.0000 mg | INTRAVENOUS | Status: AC
  Filled 2014-11-11: qty 200

## 2014-11-11 MED ORDER — ENALAPRIL MALEATE 5 MG PO TABS
10.0000 mg | ORAL_TABLET | Freq: Every day | ORAL | Status: DC
  Administered 2014-11-12: 10 mg via ORAL
  Filled 2014-11-11: qty 2
  Filled 2014-11-11: qty 1

## 2014-11-11 MED ORDER — DOXYCYCLINE HYCLATE 100 MG PO TABS
100.0000 mg | ORAL_TABLET | Freq: Two times a day (BID) | ORAL | Status: DC
  Administered 2014-11-12 – 2014-11-15 (×8): 100 mg via ORAL
  Filled 2014-11-11 (×8): qty 1

## 2014-11-11 MED ORDER — ACETAMINOPHEN 500 MG PO TABS
1000.0000 mg | ORAL_TABLET | Freq: Once | ORAL | Status: AC
  Administered 2014-11-11: 1000 mg via ORAL

## 2014-11-11 MED ORDER — HEPARIN SODIUM (PORCINE) 5000 UNIT/ML IJ SOLN
5000.0000 [IU] | Freq: Three times a day (TID) | INTRAMUSCULAR | Status: DC
  Administered 2014-11-12 – 2014-11-15 (×11): 5000 [IU] via SUBCUTANEOUS
  Filled 2014-11-11 (×9): qty 1

## 2014-11-11 MED ORDER — SODIUM CHLORIDE 0.9 % IV BOLUS (SEPSIS)
1000.0000 mL | INTRAVENOUS | Status: AC
  Administered 2014-11-11: 1000 mL via INTRAVENOUS

## 2014-11-11 NOTE — Progress Notes (Signed)
Cc: Fever HPI:  Fever:  Pt has tick on his foot.    Exam:  Tick bite on foot.   A/P Fever secondary to ? Lyme Lyme titer Doxycycline 100mg  po bid

## 2014-11-11 NOTE — Progress Notes (Signed)
Patient has a tick that is still attached to right foot. Redness noted to area surrounding the point of attachment. Contacted Dr. Selena BattenKim to notify of this and for removal. Dr. Selena BattenKim says he will come to the floor to see patient. Will continue to monitor.

## 2014-11-11 NOTE — ED Notes (Addendum)
Has had respiratory symptoms for a couple of days.  Fever today.  Per brother temp 104 at home.  Also per brother pt fell in floor today and was unable to get up.  Also per brother pt is not acting right.

## 2014-11-11 NOTE — Progress Notes (Signed)
ANTIBIOTIC CONSULT NOTE - INITIAL  Pharmacy Consult for Vancomycin & Zosyn Indication: sepsis  No Known Allergies  Patient Measurements: Height: 5\' 9"  (175.3 cm) Weight: 215 lb (97.523 kg) IBW/kg (Calculated) : 70.7 Adjusted Body Weight:   Vital Signs: Temp: 101.7 F (38.7 C) (04/26 1936) Temp Source: Oral (04/26 1936) BP: 101/52 mmHg (04/26 1936) Pulse Rate: 107 (04/26 1936) Intake/Output from previous day:   Intake/Output from this shift:    Labs:  Recent Labs  11/11/14 1855  WBC 13.1*  HGB 13.2  PLT 165  CREATININE 1.69*   Estimated Creatinine Clearance: 45.5 mL/min (by C-G formula based on Cr of 1.69). No results for input(s): VANCOTROUGH, VANCOPEAK, VANCORANDOM, GENTTROUGH, GENTPEAK, GENTRANDOM, TOBRATROUGH, TOBRAPEAK, TOBRARND, AMIKACINPEAK, AMIKACINTROU, AMIKACIN in the last 72 hours.   Microbiology: Recent Results (from the past 720 hour(s))  Blood Culture (routine x 2)     Status: None (Preliminary result)   Collection Time: 11/11/14  7:10 PM  Result Value Ref Range Status   Specimen Description BLOOD LEFT HAND  Final   Special Requests BOTTLES DRAWN AEROBIC AND ANAEROBIC 6CC  Final   Culture PENDING  Incomplete   Report Status PENDING  Incomplete  Blood Culture (routine x 2)     Status: None (Preliminary result)   Collection Time: 11/11/14  7:15 PM  Result Value Ref Range Status   Specimen Description BLOOD LEFT HAND  Final   Special Requests BOTTLES DRAWN AEROBIC AND ANAEROBIC 6CC  Final   Culture PENDING  Incomplete   Report Status PENDING  Incomplete    Medical History: Past Medical History  Diagnosis Date  . Schizophrenia   . Hypertension   . High cholesterol   . Stroke 2008  . GERD (gastroesophageal reflux disease)     Medications:  Scheduled:  . [START ON 11/12/2014] vancomycin  750 mg Intravenous Q12H   Assessment: ED patient, Vancomycin and Zosyn for sepsis CrCl 45.5 ml/min   Goal of Therapy:  Vancomycin trough level 15-20  mcg/ml  Plan:  Vancomycin 2 GM IV loading dose in ED (infused as #2 Vancomycin 1 GM doses), then 750 mg IV every 12 hours Zosyn 3.375 GM IV in ED, then 3.375 GM IV every 8 hours, each dose over 4 hours Vancomycin trough at steady state Monitor renal function Labs per protocol  Raquel JamesPittman, Zaiyah Sottile Bennett 11/11/2014,9:23 PM

## 2014-11-11 NOTE — ED Provider Notes (Signed)
TIME SEEN: 6:35 PM  CHIEF COMPLAINT: Fever, cough  HPI: Pt is a 74 y.o. male with history of schizophrenia, hypertension, hyperlipidemia who presents to the emergency department with 2 days of fever, cough, body aches. In the ED patient is wheezing and hypoxic. Does not wear oxygen at home. No vomiting or diarrhea. He denies headache, neck pain, chest pain, abdominal pain. No rash. Did have an influenza vaccination last year. No known sick contacts or recent travel.  ROS: See HPI Constitutional:  fever  Eyes: no drainage  ENT: no runny nose   Cardiovascular:  no chest pain  Resp:  SOB  GI: no vomiting GU: no dysuria Integumentary: no rash  Allergy: no hives  Musculoskeletal: no leg swelling  Neurological: no slurred speech ROS otherwise negative  PAST MEDICAL HISTORY/PAST SURGICAL HISTORY:  Past Medical History  Diagnosis Date  . Schizophrenia   . Hypertension   . High cholesterol   . Stroke 2008  . GERD (gastroesophageal reflux disease)     MEDICATIONS:  Prior to Admission medications   Medication Sig Start Date End Date Taking? Authorizing Provider  aspirin EC 81 MG tablet Take 81 mg by mouth daily.    Historical Provider, MD  divalproex (DEPAKOTE) 500 MG 24 hr tablet Take 1,000 mg by mouth at bedtime.     Historical Provider, MD  enalapril (VASOTEC) 10 MG tablet Take 10 mg by mouth daily.     Historical Provider, MD  LORazepam (ATIVAN) 2 MG tablet Take 2 mg by mouth 4 (four) times daily as needed. FOR ANXIETY     Historical Provider, MD  meclizine (ANTIVERT) 25 MG tablet Take 25 mg by mouth 2 (two) times daily.    Historical Provider, MD  OLANZapine (ZYPREXA) 20 MG tablet Take 20 mg by mouth at bedtime.     Historical Provider, MD  simvastatin (ZOCOR) 40 MG tablet Take 40 mg by mouth at bedtime.      Historical Provider, MD  solifenacin (VESICARE) 10 MG tablet Take 10 mg by mouth daily.     Historical Provider, MD    ALLERGIES:  No Known Allergies  SOCIAL HISTORY:   History  Substance Use Topics  . Smoking status: Current Some Day Smoker -- 0.25 packs/day for 50 years    Types: Cigarettes  . Smokeless tobacco: Not on file  . Alcohol Use: No    FAMILY HISTORY: History reviewed. No pertinent family history.  EXAM: BP 109/63 mmHg  Pulse 122  Temp(Src) 103.2 F (39.6 C) (Oral)  Resp 24  Ht 5\' 9"  (1.753 m)  Wt 215 lb (97.523 kg)  BMI 31.74 kg/m2  SpO2 86% CONSTITUTIONAL: Alert and oriented and responds appropriately to questions. Well-appearing; well-nourished HEAD: Normocephalic EYES: Conjunctivae clear, PERRL ENT: normal nose; no rhinorrhea; moist mucous membranes; pharynx without lesions noted NECK: Supple, no meningismus, no LAD  CARD: Regular and tachycardic; S1 and S2 appreciated; no murmurs, no clicks, no rubs, no gallops RESP: Normal chest excursion without splinting, patient is tachypneic, wheezing and rhonchorous breath sounds diffusely, diminished at bases bilaterally, patient is hypoxic but no respiratory distress, speaking full sentences ABD/GI: Normal bowel sounds; non-distended; soft, non-tender, no rebound, no guarding BACK:  The back appears normal and is non-tender to palpation, there is no CVA tenderness EXT: Normal ROM in all joints; non-tender to palpation; no edema; normal capillary refill; no cyanosis    SKIN: Normal color for age and race; warm NEURO: Moves all extremities equally PSYCH: The patient's mood and manner  are appropriate. Grooming and personal hygiene are appropriate.  MEDICAL DECISION MAKING: Patient here with fever, tachycardia, tachypnea. He meets criteria. We'll start broad-spectrum antibiotics and septic workup. Will obtain labs, cultures, chest x-ray and urine. Will give vancomycin and Zosyn. We'll give DuoNeb. Discussed with patient and brother is at bedside that he will need admission given his new oxygen requirement.  ED PROGRESS: Patient's labs show leukocytosis with left shift. Flu swab negative.  Chest x-ray shows no obvious infiltrate but I feel patient has clinical pneumonia. Urine pending. Discussed with Dr. Karilyn Cota for admission to telemetry, inpatient.    CRITICAL CARE Performed by: Raelyn Number   Total critical care time: 45 minutes  Critical care time was exclusive of separately billable procedures and treating other patients.  Critical care was necessary to treat or prevent imminent or life-threatening deterioration.  Critical care was time spent personally by me on the following activities: development of treatment plan with patient and/or surrogate as well as nursing, discussions with consultants, evaluation of patient's response to treatment, examination of patient, obtaining history from patient or surrogate, ordering and performing treatments and interventions, ordering and review of laboratory studies, ordering and review of radiographic studies, pulse oximetry and re-evaluation of patient's condition.     Layla Maw Ward, DO 11/11/14 2153

## 2014-11-11 NOTE — H&P (Signed)
[facility] History and Physical  Roger Frye:[id number] DOB: 01-10-1941 DOA: 11/11/2014  Referring physician: ER, Dr. Elesa Massed PCP: Milana Obey, MD   Chief Complaint: Fever.  HPI: Roger Frye is a 74 y.o. male  This is a 74 year old man who has schizophrenia and is largely looked after by his brother at home. He now presents with 2-3 day history of fevers, 104 at home documented. At one point apparently the patient fell and was unable to get up and her brother feels that his mentation is not clear. There is no significant cough or dyspnea. He apparently has had the flu vaccination this season. He is now being admitted for further management. The patient himself wants to go home and cannot give me any clear history.   Review of Systems:  Unable to obtain secondary to the patient's altered mental status.   Past Medical History  Diagnosis Date  . Schizophrenia   . Hypertension   . High cholesterol   . Stroke 2008  . GERD (gastroesophageal reflux disease)    Past Surgical History  Procedure Laterality Date  . Appendectomy    . 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 Cataract extraction w/phaco Right 10/07/2013    Procedure: CATARACT EXTRACTION PHACO AND INTRAOCULAR LENS PLACEMENT (IOC);  Surgeon: Gemma Payor, MD;  Location: [facility];  Service: Ophthalmology;  Laterality: Right;  CDE 19.32   Social History:  reports that he has been smoking Cigarettes.  He has a 12.5 pack-year smoking history. He does not have any smokeless tobacco history on file. He reports that he does not drink alcohol or use illicit drugs.  No Known Allergies   Family history: No family history of schizophrenia.   Prior to Admission medications   Medication Sig Start Date End Date Taking? Authorizing Provider  aspirin EC 81 MG tablet Take 81 mg by mouth daily.   Yes Historical Provider, MD  divalproex (DEPAKOTE) 500 MG 24 hr tablet Take 1,000 mg by  mouth at bedtime.    Yes Historical Provider, MD  enalapril (VASOTEC) 10 MG tablet Take 10 mg by mouth daily.    Yes Historical Provider, MD  LORazepam (ATIVAN) 2 MG tablet Take 2 mg by mouth 4 (four) times daily as needed. FOR ANXIETY    Yes Historical Provider, MD  meclizine (ANTIVERT) 25 MG tablet Take 25 mg by mouth 2 (two) times daily.   Yes Historical Provider, MD  OLANZapine (ZYPREXA) 20 MG tablet Take 20 mg by mouth at bedtime.    Yes Historical Provider, MD  simvastatin (ZOCOR) 40 MG tablet Take 40 mg by mouth at bedtime.     Yes Historical Provider, MD  VESICARE 5 MG tablet Take 5 mg by mouth daily. 10/27/14  Yes Historical Provider, MD   Physical Exam: Filed Vitals:   11/11/14 1829 11/11/14 1936  BP: 109/63 101/52  Pulse: 122 107  Temp: 103.2 F (39.6 C) 101.7 F (38.7 C)  TempSrc: Oral Oral  Resp: 24 23  Height:  (1.753 m)   Weight: 97.523 kg (215 lb)   SpO2: 86% 92%    Wt Readings from Last 3 Encounters:  11/11/14 97.523 kg (215 lb)  02/28/11 99.791 kg (220 lb)    General:  Appears toxic. He is febrile. His blood pressure is slightly soft. He is somewhat confused. Eyes: PERRL, normal lids, irises & conjunctiva ENT: grossly normal hearing, lips & tongue Neck: no LAD,  masses or thyromegaly Cardiovascular: RRR, no m/r/g. No LE edema. Telemetry: SR, no arrhythmias  Respiratory: Bilateral rhonchi. No crackles or bronchial breathing. Abdomen: soft, ntnd Skin: no rash or induration seen on limited exam Musculoskeletal: grossly normal tone BUE/BLE Psychiatric: Not examined. Neurologic: grossly non-focal.          Labs on Admission:  Basic Metabolic Panel:  Recent Labs Lab 11/11/14 1855  NA 138  K 4.4  CL 104  CO2 26  GLUCOSE 130*  BUN 24*  CREATININE 1.69*  CALCIUM 8.4   Liver Function Tests:  Recent Labs Lab 11/11/14 1855  AST 19  ALT 15  ALKPHOS 56  BILITOT 0.3  PROT 6.7  ALBUMIN 3.3*   No results for input(s): LIPASE, AMYLASE in the  last 168 hours. No results for input(s): AMMONIA in the last 168 hours. CBC:  Recent Labs Lab 11/11/14 1855  WBC 13.1*  NEUTROABS 9.9*  HGB 13.2  HCT 40.4  MCV 93.7  PLT 165   Cardiac Enzymes: No results for input(s): CKTOTAL, CKMB, CKMBINDEX, TROPONINI in the last 168 hours.  BNP (last 3 results) No results for input(s): BNP in the last 8760 hours.  ProBNP (last 3 results) No results for input(s): PROBNP in the last 8760 hours.  CBG: No results for input(s): GLUCAP in the last 168 hours.  Radiological Exams on Admission: Dg Chest Port 1 View  11/11/2014   CLINICAL DATA:  New fever, difficulty breathing, recent fall  EXAM: PORTABLE CHEST - 1 VIEW  COMPARISON:  01/31/2009  FINDINGS: Limited exam because of patient positioning and body habitus. Mild cardiomegaly with vascular congestion. Minor basilar atelectasis. No definite focal pneumonia, collapse or consolidation. Negative for CHF or large effusion. Left costophrenic angle is excluded on the study. Additional images had to be obtained. No pneumothorax.  IMPRESSION: Mild cardiomegaly and vascular congestion. Basilar atelectasis. Limited exam as described   Electronically Signed   By: Judie PetitM.  Shick M.D.   On: 11/11/2014 19:52      Assessment/Plan   1. Fever. Etiology is not clear. We will treat him with broad-spectrum antibiotics. Influenza test is negative. We will check urinalysis as I don't see a result in the chart. Blood cultures have been taken. 2. Dehydration/acute renal failure. We will give him intravenous fluids. 3. Schizophrenia. Stable. 4. Hypertension. His blood pressure is slightly on the soft side and he may be hypovolemic.  Further recommendations will depend on patient's hospital progress.  Code Status: Full code  Family Communication: I discussed the plan with the patient's brother at the bedside.   Disposition Plan: Home when medically stable.   Time spent: 60 minutes.  Wilson SingerGOSRANI,NIMISH C [facility] Pager [phone].

## 2014-11-12 ENCOUNTER — Inpatient Hospital Stay (HOSPITAL_COMMUNITY): Payer: Medicare Other

## 2014-11-12 DIAGNOSIS — A419 Sepsis, unspecified organism: Principal | ICD-10-CM

## 2014-11-12 DIAGNOSIS — N179 Acute kidney failure, unspecified: Secondary | ICD-10-CM | POA: Diagnosis present

## 2014-11-12 DIAGNOSIS — A938 Other specified arthropod-borne viral fevers: Secondary | ICD-10-CM | POA: Diagnosis present

## 2014-11-12 DIAGNOSIS — F17200 Nicotine dependence, unspecified, uncomplicated: Secondary | ICD-10-CM | POA: Diagnosis present

## 2014-11-12 DIAGNOSIS — J189 Pneumonia, unspecified organism: Secondary | ICD-10-CM

## 2014-11-12 HISTORY — DX: Pneumonia, unspecified organism: J18.9

## 2014-11-12 HISTORY — DX: Other specified arthropod-borne viral fevers: A93.8

## 2014-11-12 LAB — COMPREHENSIVE METABOLIC PANEL
ALT: 13 U/L (ref 0–53)
AST: 15 U/L (ref 0–37)
Albumin: 2.9 g/dL — ABNORMAL LOW (ref 3.5–5.2)
Alkaline Phosphatase: 56 U/L (ref 39–117)
Anion gap: 5 (ref 5–15)
BILIRUBIN TOTAL: 0.4 mg/dL (ref 0.3–1.2)
BUN: 21 mg/dL (ref 6–23)
CHLORIDE: 107 mmol/L (ref 96–112)
CO2: 29 mmol/L (ref 19–32)
CREATININE: 1.49 mg/dL — AB (ref 0.50–1.35)
Calcium: 8.2 mg/dL — ABNORMAL LOW (ref 8.4–10.5)
GFR, EST AFRICAN AMERICAN: 52 mL/min — AB (ref >90)
GFR, EST NON AFRICAN AMERICAN: 45 mL/min — AB (ref >90)
GLUCOSE: 101 mg/dL — AB (ref 70–99)
Potassium: 4 mmol/L (ref 3.5–5.1)
Sodium: 141 mmol/L (ref 135–145)
Total Protein: 6.2 g/dL (ref 6.0–8.3)

## 2014-11-12 LAB — CBC
HEMATOCRIT: 38 % — AB (ref 39.0–52.0)
Hemoglobin: 12.1 g/dL — ABNORMAL LOW (ref 13.0–17.0)
MCH: 30.3 pg (ref 26.0–34.0)
MCHC: 31.8 g/dL (ref 30.0–36.0)
MCV: 95.2 fL (ref 78.0–100.0)
PLATELETS: 162 10*3/uL (ref 150–400)
RBC: 3.99 MIL/uL — ABNORMAL LOW (ref 4.22–5.81)
RDW: 13.8 % (ref 11.5–15.5)
WBC: 13.8 10*3/uL — ABNORMAL HIGH (ref 4.0–10.5)

## 2014-11-12 LAB — BRAIN NATRIURETIC PEPTIDE: B Natriuretic Peptide: 55 pg/mL (ref 0.0–100.0)

## 2014-11-12 MED ORDER — PREDNISONE 20 MG PO TABS
50.0000 mg | ORAL_TABLET | Freq: Two times a day (BID) | ORAL | Status: DC
  Administered 2014-11-13 – 2014-11-14 (×2): 50 mg via ORAL
  Filled 2014-11-12: qty 1
  Filled 2014-11-12 (×2): qty 2
  Filled 2014-11-12 (×2): qty 1
  Filled 2014-11-12: qty 2

## 2014-11-12 MED ORDER — ALBUTEROL SULFATE (2.5 MG/3ML) 0.083% IN NEBU: 2.5000 mg | INHALATION_SOLUTION | RESPIRATORY_TRACT | Status: DC | PRN

## 2014-11-12 MED ORDER — IPRATROPIUM-ALBUTEROL 0.5-2.5 (3) MG/3ML IN SOLN
3.0000 mL | Freq: Four times a day (QID) | RESPIRATORY_TRACT | Status: DC
  Administered 2014-11-12: 3 mL via RESPIRATORY_TRACT
  Filled 2014-11-12: qty 3

## 2014-11-12 MED ORDER — SENNOSIDES-DOCUSATE SODIUM 8.6-50 MG PO TABS
1.0000 | ORAL_TABLET | Freq: Every day | ORAL | Status: DC
  Administered 2014-11-12 – 2014-11-14 (×3): 1 via ORAL
  Filled 2014-11-12 (×3): qty 1

## 2014-11-12 MED ORDER — ENALAPRIL MALEATE 5 MG PO TABS
5.0000 mg | ORAL_TABLET | Freq: Every day | ORAL | Status: DC
  Administered 2014-11-13 – 2014-11-14 (×2): 5 mg via ORAL
  Filled 2014-11-12 (×2): qty 1

## 2014-11-12 MED ORDER — PREDNISONE 10 MG PO TABS
50.0000 mg | ORAL_TABLET | Freq: Two times a day (BID) | ORAL | Status: DC
  Administered 2014-11-12: 50 mg via ORAL

## 2014-11-12 MED ORDER — PREDNISONE 10 MG PO TABS
50.0000 mg | ORAL_TABLET | Freq: Two times a day (BID) | ORAL | Status: DC
  Filled 2014-11-12: qty 2
  Filled 2014-11-12: qty 1

## 2014-11-12 MED ORDER — LEVOFLOXACIN IN D5W 750 MG/150ML IV SOLN
750.0000 mg | INTRAVENOUS | Status: DC
  Administered 2014-11-12 – 2014-11-14 (×3): 750 mg via INTRAVENOUS
  Filled 2014-11-12 (×3): qty 150

## 2014-11-12 MED ORDER — MAGNESIUM HYDROXIDE 400 MG/5ML PO SUSP
15.0000 mL | Freq: Two times a day (BID) | ORAL | Status: DC
  Administered 2014-11-12 – 2014-11-15 (×6): 15 mL via ORAL
  Filled 2014-11-12 (×6): qty 30

## 2014-11-12 MED ORDER — FUROSEMIDE 20 MG PO TABS
20.0000 mg | ORAL_TABLET | Freq: Once | ORAL | Status: DC
Start: 2014-11-12 — End: 2014-11-12

## 2014-11-12 MED ORDER — IPRATROPIUM-ALBUTEROL 0.5-2.5 (3) MG/3ML IN SOLN
3.0000 mL | RESPIRATORY_TRACT | Status: DC
  Administered 2014-11-12 (×2): 3 mL via RESPIRATORY_TRACT
  Filled 2014-11-12 (×2): qty 3

## 2014-11-12 NOTE — Progress Notes (Signed)
Patient ambulated hallways with stand by assist for approximately 75 feet.  Tolerated well, minimal unsteadiness noted while using walker.  No complaints of dizziness or SOB.

## 2014-11-12 NOTE — Progress Notes (Signed)
[facility] PROGRESS NOTE  Roger Frye ION:[id number] DOB: Nov 21, 1940 DOA: 11/11/2014 PCP: Milana Obey, MD    Code Status: Full code Family Communication: Discussed with patient's brothers Disposition Plan: Discharged home with clinically appropriate   Consultants:  None  Procedures:  None  Antibiotics:  Levaquin 4/27>>  Doxycycline 4/27>>  HPI/Subjective: Patient says he was to go home. He has no complaints of chest pain, shortness of breath, or abdominal pain. He denies a cough. His brother is in the room and reports that he has been having some urinary incontinence or just can't get up to the bathroom in time.  Objective: Filed Vitals:   11/12/14 0544  BP: 144/70  Pulse: 84  Temp: 98.2 F (36.8 C)  Resp: 20   oxygen saturation 96% on room air.  Intake/Output Summary (Last 24 hours) at 11/12/14 1223 Last data filed at 11/12/14 1006  Gross per 24 hour  Intake 1082.08 ml  Output    450 ml  Net 632.08 ml   Filed Weights   11/11/14 1829 11/12/14 0104  Weight: 97.523 kg (215 lb) 98.839 kg (217 lb 14.4 oz)    Exam:   General:  Obese disheveled appearing 74 year old Caucasian man in no acute distress.  Cardiovascular: S1, S2, no murmurs rubs or gallops.  Respiratory: Bilateral rhonchi; breathing nonlabored.  Abdomen: Obese, positive bowel sounds, soft, nontender, nondistended.  Neurologic/psychiatric: He is alert and oriented to himself, his brother, and hospital. His speech is clear. He follows commands well.  Musculoskeletal/extremities: Trace of pedal edema; no acute hot red joints.   Data Reviewed: Basic Metabolic Panel:  Recent Labs Lab 11/11/14 1855 11/12/14 0632  NA 138 141  K 4.4 4.0  CL 104 107  CO2 26 29  GLUCOSE 130* 101*  BUN 24* 21  CREATININE 1.69* 1.49*  CALCIUM 8.4 8.2*   Liver Function Tests:  Recent Labs Lab 11/11/14 1855 11/12/14 0632  AST 19 15  ALT 15 13  ALKPHOS 56 56  BILITOT 0.3 0.4  PROT 6.7  6.2  ALBUMIN 3.3* 2.9*   No results for input(s): LIPASE, AMYLASE in the last 168 hours. No results for input(s): AMMONIA in the last 168 hours. CBC:  Recent Labs Lab 11/11/14 1855 11/12/14 0632  WBC 13.1* 13.8*  NEUTROABS 9.9*  --   HGB 13.2 12.1*  HCT 40.4 38.0*  MCV 93.7 95.2  PLT 165 162   Cardiac Enzymes: No results for input(s): CKTOTAL, CKMB, CKMBINDEX, TROPONINI in the last 168 hours. BNP (last 3 results)  Recent Labs  11/12/14 0632  BNP 55.0    ProBNP (last 3 results) No results for input(s): PROBNP in the last 8760 hours.  CBG: No results for input(s): GLUCAP in the last 168 hours.  Recent Results (from the past 240 hour(s))  Blood Culture (routine x 2)     Status: None (Preliminary result)   Collection Time: 11/11/14  7:10 PM  Result Value Ref Range Status   Specimen Description BLOOD LEFT HAND  Final   Special Requests BOTTLES DRAWN AEROBIC AND ANAEROBIC 6CC  Final   Culture NO GROWTH 1 DAY  Final   Report Status PENDING  Incomplete  Blood Culture (routine x 2)     Status: None (Preliminary result)   Collection Time: 11/11/14  7:15 PM  Result Value Ref Range Status   Specimen Description BLOOD LEFT HAND  Final   Special Requests BOTTLES DRAWN AEROBIC AND ANAEROBIC 6CC  Final   Culture NO GROWTH 1 DAY  Final   Report Status PENDING  Incomplete     Studies: Dg Chest 2 View  11/12/2014   CLINICAL DATA:  Fever, cough and weakness.  EXAM: CHEST  2 VIEW  COMPARISON:  Single view of the chest 11/11/2014. PA and lateral chest 11/12/2008 and 07/07/2008.  FINDINGS: Patchy airspace disease is identified in the left lower lobe. The right lung appears clear. Heart size is normal. No pneumothorax or pleural effusion.  IMPRESSION: Left lower lobe airspace disease most compatible with pneumonia. Recommend followup films to clearing.   Electronically Signed   By: Drusilla Kanner M.D.   On: 11/12/2014 11:24   Dg Chest Port 1 View  11/11/2014   CLINICAL DATA:  New  fever, difficulty breathing, recent fall  EXAM: PORTABLE CHEST - 1 VIEW  COMPARISON:  01/31/2009  FINDINGS: Limited exam because of patient positioning and body habitus. Mild cardiomegaly with vascular congestion. Minor basilar atelectasis. No definite focal pneumonia, collapse or consolidation. Negative for CHF or large effusion. Left costophrenic angle is excluded on the study. Additional images had to be obtained. No pneumothorax.  IMPRESSION: Mild cardiomegaly and vascular congestion. Basilar atelectasis. Limited exam as described   Electronically Signed   By: Judie Petit.  Shick M.D.   On: 11/11/2014 19:52    Scheduled Meds: . aspirin EC  81 mg Oral Daily  . darifenacin  7.5 mg Oral Daily  . divalproex  1,000 mg Oral QHS  . doxycycline  100 mg Oral Q12H  . enalapril  10 mg Oral Daily  . heparin  5,000 Units Subcutaneous 3 times per day  . meclizine  25 mg Oral BID  . OLANZapine  20 mg Oral QHS  . simvastatin  40 mg Oral QHS   Continuous Infusions: . sodium chloride 10 mL/hr at 11/12/14 1038   Assessment and plan:  Principal Problem:   Tick borne fever Active Problems:   CAP (community acquired pneumonia)   Sepsis   Schizophrenia   Hypertension   Acute kidney injury   Tobacco use disorder   1. Early sepsis with fever. The patient's temperature was 103.2 on admission. His white blood cell count was 13.1 on admission. His urinalysis and chest x-ray on admission were unremarkable. Blood cultures were ordered and are negative to date. Early this morning, the nurse noticed a tick on the patient's right foot. This was taken off by Dr. Selena Batten. Patient with subsequent restarted on doxycycline orally. Follow-up chest x-ray reveals left lower lobe pneumonia. Therefore, will add IV Levaquin.  Probable tickborne illness/infection. We'll order Lyme disease and [place] spotted fever antibody/titers. Doxycycline started this morning and will be continued.  Community-acquired  pneumonia. Following IV fluid hydration, the patient's follow-up chest x-ray revealed left lower lobe pneumonia. As above, Levaquin will be started. We'll add duo nebulizer.  Acute kidney injury. The patient has no reported history of chronic kidney disease. His creatinine was within normal limits in 2015. His creatinine was 1.69 on admission. He was started on IV fluids with improvement in his creatinine. Urine output has been nonoliguric. We'll continue gentle IV fluids and continue to monitor his renal function. This is likely secondary to prerenal azotemia.  Schizophrenia. Currently stable. We'll continue Zyprexa and Depakote.  Essential hypertension. The patient was restarted on enalapril as it was not held. Given the improvement in his creatinine with hydration, will continue with but decrease the dose until his renal function improves more.  Tobacco abuse. The patient was advised to stop smoking.  Time spent: 35 minutes    Leiloni Smithers  [facility] Pager [phone] If 7PM-7AM, please contact night-coverage at [email or web address], password [email or web address] 11/12/2014, 12:23 PM  LOS: 1 day

## 2014-11-12 NOTE — Care Management Utilization Note (Signed)
UR completed 

## 2014-11-12 NOTE — Progress Notes (Signed)
Dr. Selena BattenKim removed tick from the right foot. Patient tolerated well. Will continue to monitor.

## 2014-11-13 DIAGNOSIS — R7881 Bacteremia: Secondary | ICD-10-CM | POA: Diagnosis present

## 2014-11-13 DIAGNOSIS — J9601 Acute respiratory failure with hypoxia: Secondary | ICD-10-CM

## 2014-11-13 DIAGNOSIS — J209 Acute bronchitis, unspecified: Secondary | ICD-10-CM

## 2014-11-13 LAB — CBC
HCT: 40.2 % (ref 39.0–52.0)
HEMOGLOBIN: 12.8 g/dL — AB (ref 13.0–17.0)
MCH: 30.2 pg (ref 26.0–34.0)
MCHC: 31.8 g/dL (ref 30.0–36.0)
MCV: 94.8 fL (ref 78.0–100.0)
PLATELETS: 169 10*3/uL (ref 150–400)
RBC: 4.24 MIL/uL (ref 4.22–5.81)
RDW: 13.4 % (ref 11.5–15.5)
WBC: 11.4 10*3/uL — ABNORMAL HIGH (ref 4.0–10.5)

## 2014-11-13 LAB — URINE CULTURE
Colony Count: NO GROWTH
Culture: NO GROWTH

## 2014-11-13 LAB — B. BURGDORFI ANTIBODIES: B burgdorferi Ab IgG+IgM: <0.91 {ISR} (ref 0.00–0.90)

## 2014-11-13 MED ORDER — VANCOMYCIN HCL IN DEXTROSE 750-5 MG/150ML-% IV SOLN
750.0000 mg | Freq: Two times a day (BID) | INTRAVENOUS | Status: DC
  Administered 2014-11-13 – 2014-11-14 (×2): 750 mg via INTRAVENOUS
  Filled 2014-11-13 (×9): qty 150

## 2014-11-13 MED ORDER — IPRATROPIUM-ALBUTEROL 0.5-2.5 (3) MG/3ML IN SOLN
3.0000 mL | Freq: Four times a day (QID) | RESPIRATORY_TRACT | Status: DC
  Administered 2014-11-13 – 2014-11-15 (×7): 3 mL via RESPIRATORY_TRACT
  Filled 2014-11-13 (×8): qty 3

## 2014-11-13 MED ORDER — VANCOMYCIN HCL IN DEXTROSE 1-5 GM/200ML-% IV SOLN
1000.0000 mg | Freq: Once | INTRAVENOUS | Status: AC
  Administered 2014-11-13: 1000 mg via INTRAVENOUS
  Filled 2014-11-13: qty 200

## 2014-11-13 MED ORDER — VANCOMYCIN HCL IN DEXTROSE 1-5 GM/200ML-% IV SOLN
INTRAVENOUS | Status: AC
Start: 2014-11-13 — End: 2014-11-13
  Filled 2014-11-13: qty 200

## 2014-11-13 NOTE — Progress Notes (Addendum)
Per lab aerobic bottle showed GRAM POSITIVE COCCI, on call doctor Arvilla MarketA. Kakrakandy texted page.

## 2014-11-13 NOTE — Progress Notes (Signed)
ANTIBIOTIC CONSULT NOTE - FOLLOW UP  Pharmacy Consult for Vancomycin Indication: bacteremia  No Known Allergies  Patient Measurements: Height:  (175.3 cm) Weight: 217 lb 14.4 oz (98.839 kg) IBW/kg (Calculated) : 70.7  Vital Signs: Temp: 98.4 F (36.9 C) (04/28 0553) Temp Source: Oral (04/28 0553) BP: 146/82 mmHg (04/28 0553) Pulse Rate: 85 (04/27 2343) Intake/Output from previous day: 04/27 0701 - 04/28 0700 In: 2731.8 [P.O.:720; I.V.:1861.8; IV Piggyback:150] Out: 900 [Urine:900] Intake/Output from this shift:    Labs:  Recent Labs  11/11/14 1855 11/12/14 0632 11/13/14 0605  WBC 13.1* 13.8* 11.4*  HGB 13.2 12.1* 12.8*  PLT 165 162 169  CREATININE 1.69* 1.49*  --    Estimated Creatinine Clearance: 51.9 mL/min (by C-G formula based on Cr of 1.49). No results for input(s): VANCOTROUGH, VANCOPEAK, VANCORANDOM, GENTTROUGH, GENTPEAK, GENTRANDOM, TOBRATROUGH, TOBRAPEAK, TOBRARND, AMIKACINPEAK, AMIKACINTROU, AMIKACIN in the last 72 hours.   Microbiology: Recent Results (from the past 720 hour(s))  Blood Culture (routine x 2)     Status: None (Preliminary result)   Collection Time: 11/11/14  7:10 PM  Result Value Ref Range Status   Specimen Description BLOOD LEFT HAND  Final   Special Requests BOTTLES DRAWN AEROBIC AND ANAEROBIC 6CC  Final   Culture   Final    GRAM POSITIVE COCCI GRAM STAIN RESULTS CALLED TO WILSON A AT 0018 ON [id number] BY FORSYTH K    Report Status PENDING  Incomplete  Blood Culture (routine x 2)     Status: None (Preliminary result)   Collection Time: 11/11/14  7:15 PM  Result Value Ref Range Status   Specimen Description BLOOD LEFT HAND  Final   Special Requests BOTTLES DRAWN AEROBIC AND ANAEROBIC 6CC  Final   Culture NO GROWTH 1 DAY  Final   Report Status PENDING  Incomplete    Anti-infectives    Start     Dose/Rate Route Frequency Ordered Stop   11/13/14 0130  vancomycin (VANCOCIN) IVPB 1000 mg/200 mL premix     1,000 mg 200 mL/hr over  60 Minutes Intravenous  Once 11/13/14 0129 11/13/14 0351   11/12/14 1300  levofloxacin (LEVAQUIN) IVPB 750 mg     750 mg 100 mL/hr over 90 Minutes Intravenous Every 24 hours 11/12/14 1238     11/12/14 1000  vancomycin (VANCOCIN) IVPB 750 mg/150 ml premix  Status:  Discontinued     750 mg 150 mL/hr over 60 Minutes Intravenous Every 12 hours 11/11/14 2122 11/11/14 2343   11/12/14 0400  piperacillin-tazobactam (ZOSYN) IVPB 3.375 g  Status:  Discontinued     3.375 g 12.5 mL/hr over 240 Minutes Intravenous Every 8 hours 11/11/14 2121 11/11/14 2343   11/11/14 2345  doxycycline (VIBRA-TABS) tablet 100 mg     100 mg Oral Every 12 hours 11/11/14 2342     11/11/14 1945  vancomycin (VANCOCIN) IVPB 1000 mg/200 mL premix     1,000 mg 200 mL/hr over 60 Minutes Intravenous Every 1 hr x 2 11/11/14 1912 11/11/14 2144   11/11/14 1845  piperacillin-tazobactam (ZOSYN) IVPB 3.375 g     3.375 g 100 mL/hr over 30 Minutes Intravenous  Once 11/11/14 1835 11/11/14 2032   11/11/14 1845  vancomycin (VANCOCIN) IVPB 1000 mg/200 mL premix  Status:  Discontinued     1,000 mg 200 mL/hr over 60 Minutes Intravenous  Once 11/11/14 1835 11/11/14 2032      Assessment: 74 yo M admitted with fever & leukocytosis.  A tick was also found on patient so  he was started on Doxycycline for possible tick-borne illness.  CXR + LLL PNA so Levaquin was also initiated for CAP.  1/2 Blood cx now + GPCC so he was started on Vancomycin overnight.  Leukocytosis is improved.  Scr trending down.    Goal of Therapy:  Vancomycin trough level 15-20 mcg/ml  Plan:  Vancomycin 750mg  IV q12h Check Vancomycin trough at steady state Continue Levaquin & Doxycycline per MD Change Levaquin to PO once clinically appropriate Monitor renal function and cx data  Duration of therapy per MD  Elson ClanLilliston, Etheridge Geil Michelle 11/13/2014,7:37 AM

## 2014-11-13 NOTE — Progress Notes (Signed)
ANTIBIOTIC CONSULT NOTE-Preliminary  Pharmacy Consult for vancomycin Indication: bacteremia  No Known Allergies  Patient Measurements: Height: 5\' 9"  (175.3 cm) Weight: 217 lb 14.4 oz (98.839 kg) IBW/kg (Calculated) : 70.7  Vital Signs: Temp: 98.5 F (36.9 C) (04/27 2343) Temp Source: Oral (04/27 2343) BP: 124/63 mmHg (04/27 2343) Pulse Rate: 85 (04/27 2343)  Labs:  Recent Labs  11/11/14 1855 11/12/14 0632  WBC 13.1* 13.8*  HGB 13.2 12.1*  PLT 165 162  CREATININE 1.69* 1.49*    Estimated Creatinine Clearance: 51.9 mL/min (by C-G formula based on Cr of 1.49).   Microbiology: Blood culture x 2       *4/26 at 1910 - Gram positive cocci       *4/26 at 1915 - NGTD Urine culture x 1 on 4/26 at 2136 - NGTD   Medical History: Past Medical History  Diagnosis Date  . Schizophrenia   . Hypertension   . High cholesterol   . Stroke 2008  . GERD (gastroesophageal reflux disease)     Medications:  Doxycyline 100 mg PO q12h Levofloxacin 750 mg IV q24h  Assessment: Pt is a 74 yo M who initially presented to the ED with a 2-3 day history of fevers (Tmax at home 104). After the removal of a tick from his foot, he was initiated on PO doxycyline. CXR revealed left lower lobe pneumonia and IV levofloxacin was started. Now pt has one of two blood cultures positive for Gram positive cocci. He has been afebrile for the last 24 hours. WBC was 13.8 on the last CBC, up from 13.1 on admission.   Goal of Therapy:  Vancomycin trough level 15-20 mcg/ml  Plan:  Preliminary review of pertinent patient information completed.  Protocol will be initiated with a one-time dose(s) of vancomycin 1g IV x 1.  Jeani HawkingAnnie Penn clinical pharmacist will complete review during morning rounds to assess patient and finalize treatment regimen.  Vincent GrosHolcombe, Sayge Brienza [place], [place] 11/13/2014,1:17 AM

## 2014-11-13 NOTE — Progress Notes (Signed)
Pt was walked today with no s/sx of distress or any complaints.

## 2014-11-13 NOTE — Care Management Note (Addendum)
    Page 1 of 2   11/14/2014     3:31:20 PM CARE MANAGEMENT NOTE 11/14/2014  Patient:  Roger Frye,Roger Frye   Account Number:  [id number]  Date Initiated:  11/13/2014  Documentation initiated by:  Kathyrn SheriffHILDRESS,JESSICA  Subjective/Objective Assessment:   Pt is from home, lives with brother who is his CAP aid. Pt has cane, walker, wheelchair at home. Pt independent with ADL's. Pt has no home O2 or neb machine. Pt plans to discharge home with cont. of his CAP aid services. Pt req. oxygen.     Action/Plan:   Pt will need home O2 assessment prior to discharge. Will cont to follow.   Anticipated DC Date:  11/15/2014   Anticipated DC Plan:  HOME/SELF CARE      DC Planning Services  CM consult      [facility]   Choice offered to / List presented to:  C-2 HC POA / Guardian        HH arranged  HH-2 PT      HH agency  [facility]   Status of service:  Completed, signed off Medicare Important Message given?  YES (If response is "NO", the following Medicare IM given date fields will be blank) Date Medicare IM given:  11/14/2014 Medicare IM given by:  Kathyrn SheriffHILDRESS,JESSICA Date Additional Medicare IM given:   Additional Medicare IM given by:    Discharge Disposition:  HOME W HOME HEALTH SERVICES  Per UR Regulation:    If discussed at Long Length of Stay Meetings, dates discussed:    Comments:  11/14/2014 1530 Kathyrn SheriffJessica Childress, RN, MSN, CM Pt anticiaptes discharge tomorrow. HH PT will be ordered, family has choosen [facility]. [facility]Linda[facility] notified and will obtain pt info from chart. Family informed PT has 48 hours to make first visit. [facility]. No further CM needs.  11/13/2014 1700 Kathyrn SheriffJessica Childress, RN, MSN, CM

## 2014-11-13 NOTE — Progress Notes (Signed)
[facility] PROGRESS NOTE  Roger Frye ZOX:[id number] DOB: [birth year] DOA: 11/11/2014 PCP: Milana Obey, MD    Code Status: Full code Family Communication: Discussed with patient's brother Disposition Plan: Discharged home with clinically appropriate   Consultants:  None  Procedures:  None  Antibiotics:  Vancomycin 4/28>>  Levaquin 4/27>>  Doxycycline 4/27>>  HPI/Subjective: The patient is sitting up in bed. His brother is in the room. He wants to go home. He says that he is breathing without difficulty. He denies pain.  Objective: Filed Vitals:   11/13/14 0553  BP: 146/82  Pulse:   Temp: 98.4 F (36.9 C)  Resp: 20   oxygen saturation 96% on room air.  Intake/Output Summary (Last 24 hours) at 11/13/14 1413 Last data filed at 11/13/14 1000  Gross per 24 hour  Intake 2981.83 ml  Output   1200 ml  Net 1781.83 ml   Filed Weights   11/11/14 1829 11/12/14 0104  Weight: 97.523 kg (215 lb) 98.839 kg (217 lb 14.4 oz)    Exam:   General:  Obese disheveled appearing 74 year old Caucasian man in no acute distress.  Cardiovascular: S1, S2, no murmurs rubs or gallops.  Respiratory: Few scattered rhonchus wheezes; breathing nonlabored.  Abdomen: Obese, positive bowel sounds, soft, nontender, nondistended.  Neurologic/psychiatric: He is alert and oriented to himself, his brother, and hospital. His speech is clear. He follows commands well. He tears up slightly when I told him he could not be discharged today.  Musculoskeletal/extremities: Trace of pedal edema; no acute hot red joints.   Data Reviewed: Basic Metabolic Panel:  Recent Labs Lab 11/11/14 1855 11/12/14 0632  NA 138 141  K 4.4 4.0  CL 104 107  CO2 26 29  GLUCOSE 130* 101*  BUN 24* 21  CREATININE 1.69* 1.49*  CALCIUM 8.4 8.2*   Liver Function Tests:  Recent Labs Lab 11/11/14 1855 11/12/14 0632  AST 19 15  ALT 15 13  ALKPHOS 56 56  BILITOT 0.3 0.4  PROT 6.7 6.2   ALBUMIN 3.3* 2.9*   No results for input(s): LIPASE, AMYLASE in the last 168 hours. No results for input(s): AMMONIA in the last 168 hours. CBC:  Recent Labs Lab 11/11/14 1855 11/12/14 0632 11/13/14 0605  WBC 13.1* 13.8* 11.4*  NEUTROABS 9.9*  --   --   HGB 13.2 12.1* 12.8*  HCT 40.4 38.0* 40.2  MCV 93.7 95.2 94.8  PLT 165 162 169   Cardiac Enzymes: No results for input(s): CKTOTAL, CKMB, CKMBINDEX, TROPONINI in the last 168 hours. BNP (last 3 results)  Recent Labs  11/12/14 0632  BNP 55.0    ProBNP (last 3 results) No results for input(s): PROBNP in the last 8760 hours.  CBG: No results for input(s): GLUCAP in the last 168 hours.  Recent Results (from the past 240 hour(s))  Blood Culture (routine x 2)     Status: None (Preliminary result)   Collection Time: 11/11/14  7:10 PM  Result Value Ref Range Status   Specimen Description BLOOD LEFT HAND  Final   Special Requests BOTTLES DRAWN AEROBIC AND ANAEROBIC 6CC  Final   Culture   Final    GRAM POSITIVE COCCI GRAM STAIN RESULTS CALLED TO WILSON A AT 0018 ON [id number] BY Berton Lan K Performed at [facility]    Report Status PENDING  Incomplete  Blood Culture (routine x 2)     Status: None (Preliminary result)   Collection Time: 11/11/14  7:15 PM  Result Value Ref Range  Status   Specimen Description BLOOD LEFT HAND  Final   Special Requests BOTTLES DRAWN AEROBIC AND ANAEROBIC 6CC  Final   Culture NO GROWTH 2 DAYS  Final   Report Status PENDING  Incomplete  Urine culture     Status: None   Collection Time: 11/11/14  9:36 PM  Result Value Ref Range Status   Specimen Description URINE, CLEAN CATCH  Final   Special Requests NONE  Final   Colony Count NO GROWTH [facility]formed at [facility]   Final   Culture NO GROWTH Performed at [facility]   Final   Report Status 11/13/2014 FINAL  Final     Studies: Dg Chest 2 View  11/12/2014   CLINICAL DATA:  Fever, cough and weakness.  EXAM: CHEST  2  VIEW  COMPARISON:  Single view of the chest 11/11/2014. PA and lateral chest 11/12/2008 and 07/07/2008.  FINDINGS: Patchy airspace disease is identified in the left lower lobe. The right lung appears clear. Heart size is normal. No pneumothorax or pleural effusion.  IMPRESSION: Left lower lobe airspace disease most compatible with pneumonia. Recommend followup films to clearing.   Electronically Signed   By: Drusilla Kannerhomas  Dalessio M.D.   On: 11/12/2014 11:24   Dg Chest Port 1 View  11/11/2014   CLINICAL DATA:  New fever, difficulty breathing, recent fall  EXAM: PORTABLE CHEST - 1 VIEW  COMPARISON:  01/31/2009  FINDINGS: Limited exam because of patient positioning and body habitus. Mild cardiomegaly with vascular congestion. Minor basilar atelectasis. No definite focal pneumonia, collapse or consolidation. Negative for CHF or large effusion. Left costophrenic angle is excluded on the study. Additional images had to be obtained. No pneumothorax.  IMPRESSION: Mild cardiomegaly and vascular congestion. Basilar atelectasis. Limited exam as described   Electronically Signed   By: Judie PetitM.  Shick M.D.   On: 11/11/2014 19:52    Scheduled Meds: . aspirin EC  81 mg Oral Daily  . darifenacin  7.5 mg Oral Daily  . divalproex  1,000 mg Oral QHS  . doxycycline  100 mg Oral Q12H  . enalapril  5 mg Oral Daily  . heparin  5,000 Units Subcutaneous 3 times per day  . ipratropium-albuterol  3 mL Nebulization Q6H  . levofloxacin (LEVAQUIN) IV  750 mg Intravenous Q24H  . magnesium hydroxide  15 mL Oral BID  . meclizine  25 mg Oral BID  . OLANZapine  20 mg Oral QHS  . predniSONE  50 mg Oral BID WC  . senna-docusate  1 tablet Oral QHS  . simvastatin  40 mg Oral QHS  . vancomycin  750 mg Intravenous Q12H   Continuous Infusions: . sodium chloride 70 mL/hr at 11/13/14 1000   Assessment and plan:  Principal Problem:   Tick borne fever Active Problems:   CAP (community acquired pneumonia)   Sepsis   Gram-positive  bacteremia   Acute respiratory failure with hypoxia   Acute bronchitis   Schizophrenia   Hypertension   Acute kidney injury   Tobacco use disorder   1. Early sepsis with fever. The patient's temperature was 103.2 on admission. His white blood cell count was 13.1 on admission. His urinalysis and chest x-ray on admission were unremarkable. Blood cultures were ordered and one out of 2 blood cultures became positive for gram-positive cocci. Identification is pending. Query contaminant. Vancomycin ordered and started empirically. Following admission, a nurse noticed a tick on the patient's right foot. This was taken off by Dr. Selena BattenKim. Patient with  subsequent restarted on doxycycline orally. Follow-up chest x-ray revealed left lower lobe pneumonia so Levaquin was started.  Gram-positive cocci bacteremia. One out of 2 cultures became positive. This is likely a contaminant, but vancomycin was started empirically. Will await identification.  Probable tickborne illness/infection.  Lyme disease and [place] spotted fever antibody/titers were ordered and are pending. We'll continue doxycycline.  Community-acquired pneumonia and acute bronchospasm/bronchitis Following IV fluid hydration, the patient's follow-up chest x-ray revealed left lower lobe pneumonia. As above, Levaquin was started. He has rhonchus wheezing, so twice a day dosing of prednisone and DuoNeb nebulizer were started. He has slightly less rhonchus wheezing.  Acute respiratory failure with hypoxia. The patient's oxygen saturation on room air has periodically fallen to the 87-88 range. This is likely from pneumonia and bronchitis. Will start consistent oxygen supplementation and continue to monitor.  Acute kidney injury. The patient has no reported history of chronic kidney disease. His creatinine was within normal limits in 2015. His creatinine was 1.69 on admission. He was started on IV fluids with improvement in his creatinine.  Urine output has been nonoliguric. We'll continue gentle IV fluids and continue to monitor his renal function. This is likely secondary to prerenal azotemia.  Schizophrenia. Currently stable. We'll continue Zyprexa and Depakote.  Essential hypertension. The patient was restarted on enalapril as it was not held. Given the improvement in his creatinine with hydration, will continue with but with the decreased dose until his renal function improves more.  Tobacco abuse. The patient was advised to stop smoking.    Time spent: 35 minutes    Makenzie Vittorio  [facility] Pager [phone] If 7PM-7AM, please contact night-coverage at [email or web address], password [facility] [place] 11/13/2014, 2:13 PM  LOS: 2 days

## 2014-11-13 NOTE — Progress Notes (Signed)
[facility] PROGRESS NOTE  TAJUAN Frye GEX:[id number] DOB: Nov 15, 1940 DOA: 11/11/2014 PCP: Milana Obey, MD    Code Status: Full code Family Communication: Discussed with patient's brother Disposition Plan: Discharged home with clinically appropriate   Consultants:  None  Procedures:  None  Antibiotics:  Vancomycin 4/28>>  Levaquin 4/27>>  Doxycycline 4/27>>  HPI/Subjective: The patient is sitting up in bed. His brother is in the room. He wants to go home. He says that he is breathing without difficulty. He denies pain.  Objective: Filed Vitals:   11/13/14 0553  BP: 146/82  Pulse:   Temp: 98.4 F (36.9 C)  Resp: 20   oxygen saturation 96% on room air.  Intake/Output Summary (Last 24 hours) at 11/13/14 1343 Last data filed at 11/13/14 1000  Gross per 24 hour  Intake 2981.83 ml  Output   1200 ml  Net 1781.83 ml   Filed Weights   11/11/14 1829 11/12/14 0104  Weight: 97.523 kg (215 lb) 98.839 kg (217 lb 14.4 oz)    Exam:   General:  Obese disheveled appearing 74 year old Caucasian man in no acute distress.  Cardiovascular: S1, S2, no murmurs rubs or gallops.  Respiratory: Few scattered rhonchus wheezes; breathing nonlabored.  Abdomen: Obese, positive bowel sounds, soft, nontender, nondistended.  Neurologic/psychiatric: He is alert and oriented to himself, his brother, and hospital. His speech is clear. He follows commands well. He tears up slightly when I told him he could not be discharged today.  Musculoskeletal/extremities: Trace of pedal edema; no acute hot red joints.   Data Reviewed: Basic Metabolic Panel:  Recent Labs Lab 11/11/14 1855 11/12/14 0632  NA 138 141  K 4.4 4.0  CL 104 107  CO2 26 29  GLUCOSE 130* 101*  BUN 24* 21  CREATININE 1.69* 1.49*  CALCIUM 8.4 8.2*   Liver Function Tests:  Recent Labs Lab 11/11/14 1855 11/12/14 0632  AST 19 15  ALT 15 13  ALKPHOS 56 56  BILITOT 0.3 0.4  PROT 6.7 6.2   ALBUMIN 3.3* 2.9*   No results for input(s): LIPASE, AMYLASE in the last 168 hours. No results for input(s): AMMONIA in the last 168 hours. CBC:  Recent Labs Lab 11/11/14 1855 11/12/14 0632 11/13/14 0605  WBC 13.1* 13.8* 11.4*  NEUTROABS 9.9*  --   --   HGB 13.2 12.1* 12.8*  HCT 40.4 38.0* 40.2  MCV 93.7 95.2 94.8  PLT 165 162 169   Cardiac Enzymes: No results for input(s): CKTOTAL, CKMB, CKMBINDEX, TROPONINI in the last 168 hours. BNP (last 3 results)  Recent Labs  11/12/14 0632  BNP 55.0    ProBNP (last 3 results) No results for input(s): PROBNP in the last 8760 hours.  CBG: No results for input(s): GLUCAP in the last 168 hours.  Recent Results (from the past 240 hour(s))  Blood Culture (routine x 2)     Status: None (Preliminary result)   Collection Time: 11/11/14  7:10 PM  Result Value Ref Range Status   Specimen Description BLOOD LEFT HAND  Final   Special Requests BOTTLES DRAWN AEROBIC AND ANAEROBIC 6CC  Final   Culture   Final    GRAM POSITIVE COCCI GRAM STAIN RESULTS CALLED TO WILSON A AT 0018 ON [id number] BY Berton Lan K Performed at [facility]    Report Status PENDING  Incomplete  Blood Culture (routine x 2)     Status: None (Preliminary result)   Collection Time: 11/11/14  7:15 PM  Result Value Ref Range  Status   Specimen Description BLOOD LEFT HAND  Final   Special Requests BOTTLES DRAWN AEROBIC AND ANAEROBIC 6CC  Final   Culture NO GROWTH 2 DAYS  Final   Report Status PENDING  Incomplete  Urine culture     Status: None   Collection Time: 11/11/14  9:36 PM  Result Value Ref Range Status   Specimen Description URINE, CLEAN CATCH  Final   Special Requests NONE  Final   Colony Count NO GROWTH [facility]formed at [facility]   Final   Culture NO GROWTH Performed at [facility]   Final   Report Status 11/13/2014 FINAL  Final     Studies: Dg Chest 2 View  11/12/2014   CLINICAL DATA:  Fever, cough and weakness.  EXAM: CHEST  2  VIEW  COMPARISON:  Single view of the chest 11/11/2014. PA and lateral chest 11/12/2008 and 07/07/2008.  FINDINGS: Patchy airspace disease is identified in the left lower lobe. The right lung appears clear. Heart size is normal. No pneumothorax or pleural effusion.  IMPRESSION: Left lower lobe airspace disease most compatible with pneumonia. Recommend followup films to clearing.   Electronically Signed   By: Drusilla Kannerhomas  Dalessio M.D.   On: 11/12/2014 11:24   Dg Chest Port 1 View  11/11/2014   CLINICAL DATA:  New fever, difficulty breathing, recent fall  EXAM: PORTABLE CHEST - 1 VIEW  COMPARISON:  01/31/2009  FINDINGS: Limited exam because of patient positioning and body habitus. Mild cardiomegaly with vascular congestion. Minor basilar atelectasis. No definite focal pneumonia, collapse or consolidation. Negative for CHF or large effusion. Left costophrenic angle is excluded on the study. Additional images had to be obtained. No pneumothorax.  IMPRESSION: Mild cardiomegaly and vascular congestion. Basilar atelectasis. Limited exam as described   Electronically Signed   By: Judie PetitM.  Shick M.D.   On: 11/11/2014 19:52    Scheduled Meds: . aspirin EC  81 mg Oral Daily  . darifenacin  7.5 mg Oral Daily  . divalproex  1,000 mg Oral QHS  . doxycycline  100 mg Oral Q12H  . enalapril  5 mg Oral Daily  . heparin  5,000 Units Subcutaneous 3 times per day  . ipratropium-albuterol  3 mL Nebulization Q6H  . levofloxacin (LEVAQUIN) IV  750 mg Intravenous Q24H  . magnesium hydroxide  15 mL Oral BID  . meclizine  25 mg Oral BID  . OLANZapine  20 mg Oral QHS  . predniSONE  50 mg Oral BID WC  . senna-docusate  1 tablet Oral QHS  . simvastatin  40 mg Oral QHS  . vancomycin  750 mg Intravenous Q12H   Continuous Infusions: . sodium chloride 70 mL/hr at 11/13/14 1000   Assessment and plan:  Principal Problem:   Tick borne fever Active Problems:   CAP (community acquired pneumonia)   Sepsis   Gram-positive  bacteremia   Acute respiratory failure with hypoxia   Acute bronchitis   Schizophrenia   Hypertension   Acute kidney injury   Tobacco use disorder   1. Early sepsis with fever. The patient's temperature was 103.2 on admission. His white blood cell count was 13.1 on admission. His urinalysis and chest x-ray on admission were unremarkable. Blood cultures were ordered and one out of 2 blood cultures became positive for gram-positive cocci. Identification is pending. Query contaminant. Vancomycin ordered and started empirically. Following admission, a nurse noticed a tick on the patient's right foot. This was taken off by Dr. Selena BattenKim. Patient with  subsequent restarted on doxycycline orally. Follow-up chest x-ray revealed left lower lobe pneumonia so Levaquin was started.  Gram-positive cocci bacteremia. One out of 2 cultures became positive. This is likely a contaminant, but vancomycin was started empirically. Will await identification.  Probable tickborne illness/infection.  Lyme disease and [place] spotted fever antibody/titers were ordered and are pending. We'll continue doxycycline.  Community-acquired pneumonia and acute bronchospasm/bronchitis Following IV fluid hydration, the patient's follow-up chest x-ray revealed left lower lobe pneumonia. As above, Levaquin was started. He has rhonchus wheezing, so twice a day dosing of prednisone and DuoNeb nebulizer were started. He has slightly less rhonchus wheezing.  Acute kidney injury. The patient has no reported history of chronic kidney disease. His creatinine was within normal limits in 2015. His creatinine was 1.69 on admission. He was started on IV fluids with improvement in his creatinine. Urine output has been nonoliguric. We'll continue gentle IV fluids and continue to monitor his renal function. This is likely secondary to prerenal azotemia.  Schizophrenia. Currently stable. We'll continue Zyprexa and Depakote.  Essential  hypertension. The patient was restarted on enalapril as it was not held. Given the improvement in his creatinine with hydration, will continue with but with the decreased dose until his renal function improves more.  Tobacco abuse. The patient was advised to stop smoking.    Time spent: 35 minutes    Aaronmichael Brumbaugh  [facility] Pager [phone] If 7PM-7AM, please contact night-coverage at [email or web address], password [facility] 11/13/2014, 1:43 PM  LOS: 2 days

## 2014-11-14 LAB — BASIC METABOLIC PANEL
Anion gap: 7 (ref 5–15)
BUN: 20 mg/dL (ref 6–23)
CO2: 27 mmol/L (ref 19–32)
Calcium: 8.7 mg/dL (ref 8.4–10.5)
Chloride: 107 mmol/L (ref 96–112)
Creatinine, Ser: 1.25 mg/dL (ref 0.50–1.35)
GFR calc Af Amer: 65 mL/min — ABNORMAL LOW (ref >90)
GFR calc non Af Amer: 56 mL/min — ABNORMAL LOW (ref >90)
Glucose, Bld: 171 mg/dL — ABNORMAL HIGH (ref 70–99)
Potassium: 4.9 mmol/L (ref 3.5–5.1)
Sodium: 141 mmol/L (ref 135–145)

## 2014-11-14 LAB — CULTURE, BLOOD (ROUTINE X 2)

## 2014-11-14 LAB — CBC
HCT: 37.4 % — ABNORMAL LOW (ref 39.0–52.0)
Hemoglobin: 12 g/dL — ABNORMAL LOW (ref 13.0–17.0)
MCH: 29.9 pg (ref 26.0–34.0)
MCHC: 32.1 g/dL (ref 30.0–36.0)
MCV: 93.3 fL (ref 78.0–100.0)
Platelets: 183 10*3/uL (ref 150–400)
RBC: 4.01 MIL/uL — ABNORMAL LOW (ref 4.22–5.81)
RDW: 13.5 % (ref 11.5–15.5)
WBC: 24.2 10*3/uL — ABNORMAL HIGH (ref 4.0–10.5)

## 2014-11-14 LAB — RMSF, IGG, IFA: RMSF, IGG, IFA: 1:128 {titer} — ABNORMAL HIGH

## 2014-11-14 LAB — ROCKY MTN SPOTTED FVR ABS PNL(IGG+IGM)
RMSF IgG: UNDETERMINED
RMSF IgM: 0.23 index (ref 0.00–0.89)

## 2014-11-14 MED ORDER — LEVOFLOXACIN 750 MG PO TABS
750.0000 mg | ORAL_TABLET | Freq: Every day | ORAL | Status: DC
  Administered 2014-11-15: 750 mg via ORAL
  Filled 2014-11-14: qty 1

## 2014-11-14 MED ORDER — ENALAPRIL MALEATE 5 MG PO TABS
10.0000 mg | ORAL_TABLET | Freq: Every day | ORAL | Status: DC
  Administered 2014-11-15: 10 mg via ORAL
  Filled 2014-11-14 (×2): qty 2

## 2014-11-14 MED ORDER — PREDNISONE 20 MG PO TABS
40.0000 mg | ORAL_TABLET | Freq: Two times a day (BID) | ORAL | Status: DC
  Administered 2014-11-14 – 2014-11-15 (×2): 40 mg via ORAL
  Filled 2014-11-14 (×2): qty 2

## 2014-11-14 NOTE — Progress Notes (Signed)
PHARMACIST - PHYSICIAN COMMUNICATION DR:   Sherrie MustacheFisher CONCERNING: Antibiotic IV to Oral Route Change Policy  RECOMMENDATION: This patient is receiving Levaquin by the intravenous route.  Based on criteria approved by the Pharmacy and Therapeutics Committee, the antibiotic(s) is/are being converted to the equivalent oral dose form(s).   DESCRIPTION: These criteria include:  Patient being treated for a respiratory tract infection, urinary tract infection, cellulitis or clostridium difficile associated diarrhea if on metronidazole  The patient is not neutropenic and does not exhibit a GI malabsorption state  The patient is eating (either orally or via tube) and/or has been taking other orally administered medications for a least 24 hours  The patient is improving clinically and has a Tmax < 100.5  If you have questions about this conversion, please contact the Pharmacy Department  [x]   [phone]( [phone] )  [facility] []   [phone]( [phone] )  [facility]  []   [phone]( [phone] )  [facility] []   [phone]( [phone] )  [facility]   Junita PushMichelle Matisse Roskelley, PharmD, BCPS 11/14/2014@12 :25 PM

## 2014-11-14 NOTE — Progress Notes (Signed)
Patient pulled iv out. Refused to have it restarted because he states he is going home in the morning. Contacted midlevel to inform that patient is refusing iv restart. Midlevel stated it is ok to leave iv out.

## 2014-11-14 NOTE — Progress Notes (Addendum)
[facility] PROGRESS NOTE  Roger Frye ZOX:[id number] DOB: February 22, 1941 DOA: 11/11/2014 PCP: Milana Obey, MD    Code Status: Full code Family Communication: Discussed with patient's brother Disposition Plan: Discharged home with clinically appropriate; likely in the next 24 hours.   Consultants:  None  Procedures:  None  Antibiotics:  Vancomycin 4/28>>  Levaquin 4/27>>  Doxycycline 4/27>>  HPI/Subjective: The patient is sitting up in in the chair. He denies chest pain, shortness of breath, or cough.  Objective: Filed Vitals:   11/14/14 1011  BP: 162/80  Pulse:   Temp:   Resp:    oxygen saturation 92% on room air. Temperature 98.5. Pulse rate 69. The rate 20.  Intake/Output Summary (Last 24 hours) at 11/14/14 1027 Last data filed at 11/14/14 0900  Gross per 24 hour  Intake 1251.67 ml  Output    900 ml  Net 351.67 ml   Filed Weights   11/11/14 1829 11/12/14 0104  Weight: 97.523 kg (215 lb) 98.839 kg (217 lb 14.4 oz)    Exam:   General:  Obese disheveled appearing 74 year old Caucasian man in no acute distress, but overall looks better.  Cardiovascular: S1, S2, no murmurs rubs or gallops.  Respiratory: Few scattered rhonchus wheezes-decreased from yesterday; breathing mildly labored after ambulation with PT.  Abdomen: Obese, positive bowel sounds, soft, nontender, nondistended.  Neurologic/psychiatric: He is alert and oriented to himself, his brother, and hospital. His speech is clear. He follows commands well.  Musculoskeletal/extremities: Trace of pedal edema; no acute hot red joints.   Data Reviewed: Basic Metabolic Panel:  Recent Labs Lab 11/11/14 1855 11/12/14 0632 11/14/14 0622  NA 138 141 141  K 4.4 4.0 4.9  CL 104 107 107  CO2 GLUCOSE 130* 101* 171*  BUN 24* 21 20  CREATININE 1.69* 1.49* 1.25  CALCIUM 8.4 8.2* 8.7   Liver Function Tests:  Recent Labs Lab 11/11/14 1855 11/12/14 0632  AST 19 15  ALT 15  13  ALKPHOS 56 56  BILITOT 0.3 0.4  PROT 6.7 6.2  ALBUMIN 3.3* 2.9*   No results for input(s): LIPASE, AMYLASE in the last 168 hours. No results for input(s): AMMONIA in the last 168 hours. CBC:  Recent Labs Lab 11/11/14 1855 11/12/14 8119 11/13/14 0605 11/14/14 0622  WBC 13.1* 13.8* 11.4* 24.2*  NEUTROABS 9.9*  --   --   --   HGB 13.2 12.1* 12.8* 12.0*  HCT 40.4 38.0* 40.2 37.4*  MCV 93.7 95.2 94.8 93.3  PLT 165 162 169 183   Cardiac Enzymes: No results for input(s): CKTOTAL, CKMB, CKMBINDEX, TROPONINI in the last 168 hours. BNP (last 3 results)  Recent Labs  11/12/14 0632  BNP 55.0    ProBNP (last 3 results) No results for input(s): PROBNP in the last 8760 hours.  CBG: No results for input(s): GLUCAP in the last 168 hours.  Recent Results (from the past 240 hour(s))  Blood Culture (routine x 2)     Status: None   Collection Time: 11/11/14  7:10 PM  Result Value Ref Range Status   Specimen Description BLOOD LEFT HAND  Final   Special Requests BOTTLES DRAWN AEROBIC AND ANAEROBIC 6CC  Final   Culture   Final    STAPHYLOCOCCUS SPECIES (COAGULASE NEGATIVE) Note: THE SIGNIFICANCE OF ISOLATING THIS ORGANISM FROM A SINGLE VENIPUNCTURE CANNOT BE PREDICTED WITHOUT FURTHER CLINICAL AND CULTURE CORRELATION. SUSCEPTIBILITIES AVAILABLE ONLY ON REQUEST. Note: Gram Stain Report Called to,Read Back By and Verified With: Andrey Campanile  A 0018 11/13/14 FORSYTH K Performed at [facility] Performed at [facility]    Report Status 11/14/2014 FINAL  Final  Blood Culture (routine x 2)     Status: None (Preliminary result)   Collection Time: 11/11/14  7:15 PM  Result Value Ref Range Status   Specimen Description BLOOD LEFT HAND  Final   Special Requests BOTTLES DRAWN AEROBIC AND ANAEROBIC 6CC  Final   Culture NO GROWTH 2 DAYS  Final   Report Status PENDING  Incomplete  Urine culture     Status: None   Collection Time: 11/11/14  9:36 PM  Result Value Ref Range Status    Specimen Description URINE, CLEAN CATCH  Final   Special Requests NONE  Final   Colony Count NO GROWTH Performed at [facility]   Final   Culture NO GROWTH Performed at [facility]   Final   Report Status 11/13/2014 FINAL  Final     Studies: Dg Chest 2 View  11/12/2014   CLINICAL DATA:  Fever, cough and weakness.  EXAM: CHEST  2 VIEW  COMPARISON:  Single view of the chest 11/11/2014. PA and lateral chest 11/12/2008 and 07/07/2008.  FINDINGS: Patchy airspace disease is identified in the left lower lobe. The right lung appears clear. Heart size is normal. No pneumothorax or pleural effusion.  IMPRESSION: Left lower lobe airspace disease most compatible with pneumonia. Recommend followup films to clearing.   Electronically Signed   By: Drusilla Kanner M.D.   On: 11/12/2014 11:24    Scheduled Meds: . aspirin EC  81 mg Oral Daily  . darifenacin  7.5 mg Oral Daily  . divalproex  1,000 mg Oral QHS  . doxycycline  100 mg Oral Q12H  . enalapril  5 mg Oral Daily  . heparin  5,000 Units Subcutaneous 3 times per day  . ipratropium-albuterol  3 mL Nebulization Q6H  . levofloxacin (LEVAQUIN) IV  750 mg Intravenous Q24H  . magnesium hydroxide  15 mL Oral BID  . meclizine  25 mg Oral BID  . OLANZapine  20 mg Oral QHS  . predniSONE  50 mg Oral BID WC  . senna-docusate  1 tablet Oral QHS  . simvastatin  40 mg Oral QHS  . vancomycin  750 mg Intravenous Q12H   Continuous Infusions: . sodium chloride 20 mL/hr at 11/13/14 1402   Assessment and plan:  Principal Problem:   Tick borne fever Active Problems:   CAP (community acquired pneumonia)   Sepsis   Gram-positive bacteremia   Acute respiratory failure with hypoxia   Acute bronchitis   Schizophrenia   Hypertension   Acute kidney injury   Tobacco use disorder   1. Early sepsis with fever. The patient's temperature was 103.2 on admission. His white blood cell count was 13.1 on admission. His urinalysis and chest x-ray  on admission were unremarkable. Blood cultures were ordered and one out of 2 blood cultures became positive for gram-positive cocci. Vancomycin ordered and started empirically. Following admission, a nurse noticed a tick on the patient's right foot. This was taken off by Dr. Selena Batten. Patient was subsequently started on doxycycline orally. Follow-up chest x-ray revealed left lower lobe pneumonia so Levaquin was started. Clinically, the patient's sepsis has resolved.  Gram-positive cocci bacteremia. One out of 2 cultures became positive. Vancomycin was started empirically. The identification just resulted as coagulase-negative Staphylococcus. We'll discontinue vancomycin.  Probable tickborne illness/infection.  Lyme disease and [place] spotted fever antibody/titers were  ordered. Lyme disease titer was negative. [facility] t[facility] is still pending. We'll continue doxycycline.  Community-acquired pneumonia and acute bronchospasm/bronchitis Following IV fluid hydration, the patient's follow-up chest x-ray revealed left lower lobe pneumonia. As above, Levaquin was started. He had rhonchus wheezing, so twice a day dosing of prednisone and DuoNeb nebulizer were started. He has slightly less rhonchus wheezing.  Acute respiratory failure with hypoxia. The patient's oxygen saturation on room air has periodically fallen to the 87-88 range. This was likely from pneumonia and bronchitis. Oxygen was applied.  On 4/29, his oxygen saturations improved to the 92-95 range on room air following ambulation.  Acute kidney injury. The patient has no reported history of chronic kidney disease. His creatinine was within normal limits in 2015. His creatinine was 1.69 on admission. He was started on IV fluids with improvement in his creatinine. Urine output has been nonoliguric. Will decrease IV fluids. Acute renal failure was likely secondary to prerenal azotemia.  Schizophrenia. Currently  stable. We'll continue Zyprexa and Depakote.  Essential hypertension. The patient was restarted on enalapril as it was not held. The dose was decreased because of acute kidney injury. Now that his renal function has improved and his blood pressure is elevated, will restart his home dose of 10 mg. Elevation in his blood pressure could also be secondary to steroid therapy.  Leukocytosis. The patient's white blood cell count increased to 24.2. This was thought to be secondary to steroid therapy. He is afebrile.  Tobacco abuse. The patient was advised to stop smoking.    Time spent: 25 minutes    [facility] Center[facility]ER,Saanvi Hakala  [facility] Pager [phone] If 7PM-7AM, please contact night-coverage at [email or web address], password [email or web address] 11/14/2014, 10:27 AM  LOS: 3 days

## 2014-11-14 NOTE — Evaluation (Signed)
Physical Therapy Evaluation Patient Details Name: Roger Frye MRN: [id number] DOB: 1940-08-26 Today's Date: 11/14/2014   History of Present Illness  This is a 74 year old man who has schizophrenia and is largely looked after by his brother at home. He now presents with 2-3 day history of fevers, 104 at home documented. At one point apparently the patient fell and was unable to get up and her brother feels that his mentation is not clear. There is no significant cough or dyspnea. He apparently has had the flu vaccination this season. He is now being admitted for further management. The patient himself wants to go home and cannot give me any clear history.  Clinical Impression   Pt was seen for evaluation.  He was cooperative and able to follow all directions.  He tends to be impulsive and needed to be watched closely.  He normally is independent with no assistive device at home and brother reports that he has been doing well up until he developed this recent illness.  He is found to have generalized weakness but is able to ambulate functional distances with no assistive device.  O2 sat on room air=95%.  He was able to ascend/descend a flight of stairs with close guarding.  I am recommending HHPT at d/c and pt is agreeable.    Follow Up Recommendations Home health PT    Equipment Recommendations  None recommended by PT    Recommendations for Other Services   none    Precautions / Restrictions Precautions Precautions: Fall Restrictions Weight Bearing Restrictions: No      Mobility  Bed Mobility Overal bed mobility: Modified Independent                Transfers Overall transfer level: Modified independent Equipment used: None                Ambulation/Gait Ambulation/Gait assistance: Supervision Ambulation Distance (Feet): 150 Feet Assistive device: None Gait Pattern/deviations: Decreased dorsiflexion - right;Decreased dorsiflexion - left;Shuffle   Gait velocity  interpretation: at or above normal speed for age/gender General Gait Details: pt also has poor PF during gait, gait is somewhat lumbering  Stairs Stairs: Yes Stairs assistance: Min guard Stair Management: One rail Right;Step to pattern;Forwards Number of Stairs: 10 General stair comments: pt will tend to alternate steps and this is unsafe for him...he was strongly encouraged to do a step to pattern  Wheelchair Mobility    Modified Rankin (Stroke Patients Only)       Balance Overall balance assessment: Needs assistance Sitting-balance support: No upper extremity supported;Feet supported Sitting balance-Leahy Scale: Normal     Standing balance support: No upper extremity supported;During functional activity Standing balance-Leahy Scale: Fair                               Pertinent Vitals/Pain Pain Assessment: No/denies pain    Home Living Family/patient expects to be discharged to:: Private residence Living Arrangements: Other relatives Available Help at Discharge: Family;Available 24 hours/day Type of Home: House Home Access: Stairs to enter Entrance Stairs-Rails: None Entrance Stairs-Number of Steps: 2 Home Layout: One level Home Equipment: Walker - 2 wheels;Wheelchair - manual;Shower seat      Prior Function                 Hand Dominance        Extremity/Trunk Assessment               Lower  Extremity Assessment: Generalized weakness      Cervical / Trunk Assessment: Kyphotic  Communication      Cognition Arousal/Alertness: Awake/alert Behavior During Therapy: WFL for tasks assessed/performed Overall Cognitive Status: Within Functional Limits for tasks assessed                      General Comments      Exercises        Assessment/Plan    PT Assessment All further PT needs can be met in the next venue of care  PT Diagnosis Generalized weakness;Difficulty walking   PT Problem List Decreased  strength;Decreased activity tolerance;Decreased balance;Obesity  PT Treatment Interventions     PT Goals (Current goals can be found in the Care Plan section)      Frequency     Barriers to discharge        Co-evaluation               End of Session Equipment Utilized During Treatment: Gait belt Activity Tolerance: Patient tolerated treatment well Patient left: in chair;with call bell/phone within reach;with chair alarm set;with family/visitor present Nurse Communication: Mobility status         Time: 5320-2334 PT Time Calculation (min) (ACUTE ONLY): 29 min   Charges:   PT Evaluation $Initial PT Evaluation Tier I: 1 Procedure     PT G CodesDemetrios Isaacs L 11/14/2014, 9:26 AM

## 2014-11-15 ENCOUNTER — Encounter (HOSPITAL_COMMUNITY): Payer: Self-pay | Admitting: Internal Medicine

## 2014-11-15 LAB — BASIC METABOLIC PANEL
Anion gap: 7 (ref 5–15)
BUN: 28 mg/dL — ABNORMAL HIGH (ref 6–23)
CO2: 27 mmol/L (ref 19–32)
Calcium: 8.5 mg/dL (ref 8.4–10.5)
Chloride: 107 mmol/L (ref 96–112)
Creatinine, Ser: 1.36 mg/dL — ABNORMAL HIGH (ref 0.50–1.35)
GFR, EST AFRICAN AMERICAN: 58 mL/min — AB (ref >90)
GFR, EST NON AFRICAN AMERICAN: 50 mL/min — AB (ref >90)
Glucose, Bld: 170 mg/dL — ABNORMAL HIGH (ref 70–99)
Potassium: 4.7 mmol/L (ref 3.5–5.1)
SODIUM: 141 mmol/L (ref 135–145)

## 2014-11-15 MED ORDER — ALBUTEROL SULFATE HFA 108 (90 BASE) MCG/ACT IN AERS: 2.0000 | INHALATION_SPRAY | Freq: Two times a day (BID) | RESPIRATORY_TRACT | Status: DC

## 2014-11-15 MED ORDER — PREDNISONE 10 MG PO TABS: ORAL_TABLET | ORAL | Status: DC

## 2014-11-15 MED ORDER — DOXYCYCLINE HYCLATE 100 MG PO TABS: 100.0000 mg | ORAL_TABLET | Freq: Two times a day (BID) | ORAL | Status: DC

## 2014-11-15 MED ORDER — IPRATROPIUM-ALBUTEROL 0.5-2.5 (3) MG/3ML IN SOLN: 3.0000 mL | RESPIRATORY_TRACT | Status: DC | PRN

## 2014-11-15 MED ORDER — SODIUM CHLORIDE 0.9 % IV BOLUS (SEPSIS): 500.0000 mL | Freq: Once | INTRAVENOUS | Status: DC

## 2014-11-15 NOTE — Progress Notes (Signed)
Pt walked with tech 26400ft on room air staying at 93% O2 sat.  Pt tolerated walk well.

## 2014-11-15 NOTE — Progress Notes (Signed)
Discharge teaching done with pt and family, all questions answered.  Pt taken via wheelchair to main entrance to meet bother's car.  Pt stable at time of discharge.

## 2014-11-15 NOTE — Discharge Summary (Signed)
Physician Discharge Summary  Roger Frye ZOX:[id number] DOB: Mar 24, 1941 DOA: 11/11/2014  PCP: Milana Obey, MD  Admit date: 11/11/2014 Discharge date: 11/15/2014  Time spent:  minutes  Recommendations for Outpatient Follow-up:  1. Recommend follow-up of the patient's renal function back on full dose lisinopril.  2.   Discharge Diagnoses:  1. Early sepsis with fever, secondary to tick borne infection and community-acquired pneumonia. 2. Tickborne fever, likely secondary to [facility] [place] spotted fever. 3. Community acquired pneumonia with superimposed acute bronchitis. 4. Acute respiratory failure with hypoxia secondary to pneumonia and bronchitis. Resolved. 5. Acute kidney injury. --- Patient's creatinine was 1.69 on admission and 1.36 at the time of discharge. 6. Leukocytosis, secondary to infections with contribution of steroid therapy. 7. Gram-positive bacteremia, thought to be secondary to contaminant (coagulase-negative Staphylococcus). 8. Essential hypertension. 9. Tobacco abuse. The patient was advised to stop smoking. 10. Steroid-induced hyperglycemia. 11. Schizophrenia, remained stable.    Discharge Condition: Improved.  Diet recommendation: Heart healthy.  Filed Weights   11/11/14 1829 11/12/14 0104  Weight: 97.523 kg (215 lb) 98.839 kg (217 lb 14.4 oz)    History of present illness:  The patient is a 74 year old man with a history of hypertension, schizophrenia, previous stroke, and GERD, who presented to the emergency department on 11/11/2014 with a fever. In the ED, he was febrile with a temperature of 103.33F, mildly tachycardic, and with a normal blood pressure. His oxygen saturation on room air was 86%. His chest x-ray revealed vascular congestion. He was admitted for further evaluation and management.  Hospital Course:  1. Early sepsis with fever. The patient's temperature was 103.2 on admission. His white blood cell count was 13.1 on admission. His  urinalysis and chest x-ray on admission were unremarkable. Blood cultures were ordered and one out of 2 blood cultures became positive for gram-positive cocci. Vancomycin was ordered and started empirically. This was later found to be a contaminant with coagulase-negative Staphylococcus, so vancomycin was discontinued. Following admission, a nurse noticed a tick on the patient's right foot. This was taken off by Dr. Selena Batten. Patient was subsequently started on doxycycline orally. Following hydration, chest x-ray revealed left lower lobe pneumonia which obviously fluffed out. Therefore, Levaquin was started. Clinically, the patient's sepsis resolved. Gram-positive cocci bacteremia. One out of 2 cultures became positive. Vancomycin was started empirically. The identification revealed coagulase-negative Staphylococcus. Vancomycin was discontinued. Tickborne illness/infection. When the tick was found on the patient's right foot, doxycycline was started.Lyme disease and [place] spotted fever antibody/titers were ordered. Lyme disease titer was negative. RMSF IgG was equivocal, but RMF, IgG, IFA was elevated at 1:128, likely indicating recent exposure. The patient was continued on doxycycline following hospital discharge for several more days to treat the tickborne infection and pneumonia. Community-acquired pneumonia and acute bronchospasm/bronchitis Following IV fluid hydration, the patient's follow-up chest x-ray revealed left lower lobe pneumonia. As above, Levaquin was started. He had rhonchus wheezing, so twice a day dosing of prednisone and DuoNeb nebulizer were started. At the time of discharge, he has significantly less rhonchus wheezing. He was discharged on doxycycline, albuterol nebulizer, and a prednisone taper. Acute respiratory failure with hypoxia. The patient's oxygen saturation on room air had periodically fallen to the 86-88 range. This was likely from pneumonia and bronchitis. Oxygen  was applied.  On 4/29, his oxygen saturations improved to the 92-95 range on room air following ambulation. Acute kidney injury, likely from prerenal azotemia. The patient has no reported history of chronic kidney disease. His creatinine was  within normal limits in 2015. His creatinine was 1.69 on admission. Enalapril was restarted at a lower dose of 5 mg daily. He was started on IV fluids. His urine output was nonoliguric. His creatinine improved to 1.36 at the time of discharge. Because his blood pressure trended up, he was restarted on his usual dose of enalapril. Would recommend outpatient monitoring of his renal function. Schizophrenia. Currently stable.  Zyprexa and Depakote were continued. Essential hypertension. The patient was restarted on enalapril as it was not held. The dose was decreased because of acute kidney injury. His blood pressure started trending up, possibly from steroid therapy. Enalapril was restarted at 10 mg daily.  Leukocytosis. The patient's white blood cell count increased to 24.2 from 13.8 on admission.. This was thought to be secondary to steroid therapy. He became afebrile and remained afebrile. Tobacco abuse. The patient was advised to stop smoking.    Procedures:  None  Consultations:  None  Discharge Exam: Filed Vitals:   11/15/14 0843  BP:   Pulse: 71  Temp:   Resp: 18   temperature 98.5. Pulse rate 71. Respiratory rate 18. Blood pressure 140/63. Oxygen saturation on room air 92%.  General: Obese disheveled appearing 74 year old Caucasian man in no acute distress, but overall looks better.  Cardiovascular: S1, S2, no murmurs rubs or gallops.  Respiratory: Few scattered  wheezes-decreased rhonchi; breathing non- labored at rest.  Abdomen: Obese, positive bowel sounds, soft, nontender, nondistended.  Neurologic/psychiatric: He is alert and oriented to himself, his brother, and hospital. His speech is clear. He follows commands  well.  Musculoskeletal/extremities: Trace of pedal edema; no acute hot red joints.  Discharge Instructions   Discharge Instructions    Diet - low sodium heart healthy    Complete by:  As directed      Discharge instructions    Complete by:  As directed   Take medications as prescribed. Stop smoking.     Increase activity slowly    Complete by:  As directed           Current Discharge Medication List    START taking these medications   Details  albuterol (PROVENTIL HFA;VENTOLIN HFA) 108 (90 BASE) MCG/ACT inhaler Inhale 2 puffs into the lungs 2 (two) times daily. Qty: 1 Inhaler, Refills: 2    doxycycline (VIBRA-TABS) 100 MG tablet Take 1 tablet (100 mg total) by mouth 2 (two) times daily. Antibiotic for the infections. Qty: 14 tablet, Refills: 0    predniSONE (DELTASONE) 10 MG tablet Starting tomorrow, take 5 tablets daily for 1 day; then 4 tablets the next day for 1 day; then 3 tablets the next day for 1 day; then 2 tablets the next day for 1 day; then 1 tablet the next day; then stop. Qty: 15 tablet, Refills: 0      CONTINUE these medications which have NOT CHANGED   Details  aspirin EC 81 MG tablet Take 81 mg by mouth daily.    divalproex (DEPAKOTE) 500 MG 24 hr tablet Take 1,000 mg by mouth at bedtime.     enalapril (VASOTEC) 10 MG tablet Take 10 mg by mouth daily.     LORazepam (ATIVAN) 2 MG tablet Take 2 mg by mouth 4 (four) times daily as needed. FOR ANXIETY     meclizine (ANTIVERT) 25 MG tablet Take 25 mg by mouth 2 (two) times daily.    OLANZapine (ZYPREXA) 20 MG tablet Take 20 mg by mouth at bedtime.     simvastatin (ZOCOR) 40  MG tablet Take 40 mg by mouth at bedtime.      VESICARE 5 MG tablet Take 5 mg by mouth daily. Refills: 11       No Known Allergies Follow-up Information    Follow up with [facility].   Contact information:   [address] [place] [address] [phone]       Follow up with Milana Obey,  MD. Schedule an appointment as soon as possible for a visit in 1 week.   Specialty:  Family Medicine   Contact information:   [address] [place] [address] [phone]        The results of significant diagnostics from this hospitalization (including imaging, microbiology, ancillary and laboratory) are listed below for reference.    Significant Diagnostic Studies: Dg Chest 2 View  11/12/2014   CLINICAL DATA:  Fever, cough and weakness.  EXAM: CHEST  2 VIEW  COMPARISON:  Single view of the chest 11/11/2014. PA and lateral chest 11/12/2008 and 07/07/2008.  FINDINGS: Patchy airspace disease is identified in the left lower lobe. The right lung appears clear. Heart size is normal. No pneumothorax or pleural effusion.  IMPRESSION: Left lower lobe airspace disease most compatible with pneumonia. Recommend followup films to clearing.   Electronically Signed   By: Drusilla Kanner M.D.   On: 11/12/2014 11:24   Dg Chest Port 1 View  11/11/2014   CLINICAL DATA:  New fever, difficulty breathing, recent fall  EXAM: PORTABLE CHEST - 1 VIEW  COMPARISON:  01/31/2009  FINDINGS: Limited exam because of patient positioning and body habitus. Mild cardiomegaly with vascular congestion. Minor basilar atelectasis. No definite focal pneumonia, collapse or consolidation. Negative for CHF or large effusion. Left costophrenic angle is excluded on the study. Additional images had to be obtained. No pneumothorax.  IMPRESSION: Mild cardiomegaly and vascular congestion. Basilar atelectasis. Limited exam as described   Electronically Signed   By: Judie Petit.  Shick M.D.   On: 11/11/2014 19:52    Microbiology: Recent Results (from the past 240 hour(s))  Blood Culture (routine x 2)     Status: None   Collection Time: 11/11/14  7:10 PM  Result Value Ref Range Status   Specimen Description BLOOD LEFT HAND  Final   Special Requests BOTTLES DRAWN AEROBIC AND ANAEROBIC 6CC  Final   Culture   Final    STAPHYLOCOCCUS  SPECIES (COAGULASE NEGATIVE) Note: THE SIGNIFICANCE OF ISOLATING THIS ORGANISM FROM A SINGLE VENIPUNCTURE CANNOT BE PREDICTED WITHOUT FURTHER CLINICAL AND CULTURE CORRELATION. SUSCEPTIBILITIES AVAILABLE ONLY ON REQUEST. Note: Gram Stain Report Called to,Read Back By and Verified With: Gabriel Earing [id number] 11/13/14 FORSYTH K Performed at [facility] Performed at [facility]    Report Status 11/14/2014 FINAL  Final  Blood Culture (routine x 2)     Status: None (Preliminary result)   Collection Time: 11/11/14  7:15 PM  Result Value Ref Range Status   Specimen Description BLOOD LEFT HAND  Final   Special Requests BOTTLES DRAWN AEROBIC AND ANAEROBIC 6CC  Final   Culture NO GROWTH 4 DAYS  Final   Report Status PENDING  Incomplete  Urine culture     Status: None   Collection Time: 11/11/14  9:36 PM  Result Value Ref Range Status   Specimen Description URINE, CLEAN CATCH  Final   Special Requests NONE  Final   Colony Count NO GROWTH Performed at [facility]   Final   Culture NO GROWTH Performed at  [facility]   Final   Report Status 11/13/2014 FINAL  Final     Labs: Basic Metabolic Panel:  Recent Labs Lab 11/11/14 1855 11/12/14 6045 11/14/14 0622 11/15/14 0626  NA 138 141 141 141  K 4.4 4.0 4.9 4.7  CL 104 107 107 107  CO2 GLUCOSE 130* 101* 171* 170*  BUN 24* 21 20 28*  CREATININE 1.69* 1.49* 1.25 1.36*  CALCIUM 8.4 8.2* 8.7 8.5   Liver Function Tests:  Recent Labs Lab 11/11/14 1855 11/12/14 0632  AST 19 15  ALT 15 13  ALKPHOS 56 56  BILITOT 0.3 0.4  PROT 6.7 6.2  ALBUMIN 3.3* 2.9*   No results for input(s): LIPASE, AMYLASE in the last 168 hours. No results for input(s): AMMONIA in the last 168 hours. CBC:  Recent Labs Lab 11/11/14 1855 11/12/14 4098 11/13/14 0605 11/14/14 0622  WBC 13.1* 13.8* 11.4* 24.2*  NEUTROABS 9.9*  --   --   --   HGB 13.2 12.1* 12.8* 12.0*  HCT 40.4 38.0* 40.2 37.4*  MCV 93.7 95.2 94.8  93.3  PLT 165 162 169 183   Cardiac Enzymes: No results for input(s): CKTOTAL, CKMB, CKMBINDEX, TROPONINI in the last 168 hours. BNP: BNP (last 3 results)  Recent Labs  11/12/14 0632  BNP 55.0    ProBNP (last 3 results) No results for input(s): PROBNP in the last 8760 hours.  CBG: No results for input(s): GLUCAP in the last 168 hours.     Signed:  Loraina Stauffer  [facility] 11/15/2014, 12:41 PM

## 2014-11-17 LAB — CULTURE, BLOOD (ROUTINE X 2): Culture: NO GROWTH

## 2014-11-17 LAB — B. BURGDORFI ANTIBODIES

## 2014-11-18 NOTE — Progress Notes (Signed)
UR chart review completed.  

## 2014-12-12 ENCOUNTER — Emergency Department (HOSPITAL_COMMUNITY): Payer: Medicare Other

## 2014-12-12 ENCOUNTER — Emergency Department (HOSPITAL_COMMUNITY)
Admission: EM | Admit: 2014-12-12 | Discharge: 2014-12-12 | Disposition: A | Discharge status: Home / Self Care | Payer: Medicare Other | Attending: Emergency Medicine | Admitting: Emergency Medicine

## 2014-12-12 ENCOUNTER — Encounter (HOSPITAL_COMMUNITY): Payer: Self-pay | Admitting: *Deleted

## 2014-12-12 DIAGNOSIS — Z8673 Personal history of transient ischemic attack (TIA), and cerebral infarction without residual deficits: Secondary | ICD-10-CM | POA: Diagnosis not present

## 2014-12-12 DIAGNOSIS — J189 Pneumonia, unspecified organism: Secondary | ICD-10-CM

## 2014-12-12 DIAGNOSIS — I10 Essential (primary) hypertension: Secondary | ICD-10-CM | POA: Diagnosis not present

## 2014-12-12 DIAGNOSIS — K219 Gastro-esophageal reflux disease without esophagitis: Secondary | ICD-10-CM | POA: Insufficient documentation

## 2014-12-12 DIAGNOSIS — E785 Hyperlipidemia, unspecified: Secondary | ICD-10-CM | POA: Diagnosis not present

## 2014-12-12 DIAGNOSIS — Z79899 Other long term (current) drug therapy: Secondary | ICD-10-CM | POA: Insufficient documentation

## 2014-12-12 DIAGNOSIS — Z7982 Long term (current) use of aspirin: Secondary | ICD-10-CM | POA: Insufficient documentation

## 2014-12-12 DIAGNOSIS — F209 Schizophrenia, unspecified: Secondary | ICD-10-CM | POA: Insufficient documentation

## 2014-12-12 DIAGNOSIS — Z8619 Personal history of other infectious and parasitic diseases: Secondary | ICD-10-CM | POA: Diagnosis not present

## 2014-12-12 DIAGNOSIS — I959 Hypotension, unspecified: Secondary | ICD-10-CM | POA: Diagnosis present

## 2014-12-12 LAB — CBC WITH DIFFERENTIAL/PLATELET
Basophils Absolute: 0.1 10*3/uL (ref 0.0–0.1)
Basophils Relative: 1 % (ref 0–1)
EOS PCT: 6 % — AB (ref 0–5)
Eosinophils Absolute: 0.6 10*3/uL (ref 0.0–0.7)
HCT: 38.3 % — ABNORMAL LOW (ref 39.0–52.0)
HEMOGLOBIN: 11.7 g/dL — AB (ref 13.0–17.0)
LYMPHS ABS: 2.9 10*3/uL (ref 0.7–4.0)
Lymphocytes Relative: 26 % (ref 12–46)
MCH: 29.6 pg (ref 26.0–34.0)
MCHC: 30.5 g/dL (ref 30.0–36.0)
MCV: 97 fL (ref 78.0–100.0)
MONO ABS: 1.1 10*3/uL — AB (ref 0.1–1.0)
Monocytes Relative: 9 % (ref 3–12)
NEUTROS PCT: 58 % (ref 43–77)
Neutro Abs: 6.5 10*3/uL (ref 1.7–7.7)
Platelets: 302 10*3/uL (ref 150–400)
RBC: 3.95 MIL/uL — ABNORMAL LOW (ref 4.22–5.81)
RDW: 14 % (ref 11.5–15.5)
WBC: 11.2 10*3/uL — ABNORMAL HIGH (ref 4.0–10.5)

## 2014-12-12 LAB — URINALYSIS, ROUTINE W REFLEX MICROSCOPIC
BILIRUBIN URINE: NEGATIVE
GLUCOSE, UA: 250 mg/dL — AB
Hgb urine dipstick: NEGATIVE
KETONES UR: NEGATIVE mg/dL
Leukocytes, UA: NEGATIVE
Nitrite: NEGATIVE
PROTEIN: NEGATIVE mg/dL
SPECIFIC GRAVITY, URINE: 1.02 (ref 1.005–1.030)
Urobilinogen, UA: 0.2 mg/dL (ref 0.0–1.0)
pH: 6.5 (ref 5.0–8.0)

## 2014-12-12 LAB — COMPREHENSIVE METABOLIC PANEL
ALBUMIN: 3.1 g/dL — AB (ref 3.5–5.0)
ALK PHOS: 55 U/L (ref 38–126)
ALT: 11 U/L — ABNORMAL LOW (ref 17–63)
AST: 14 U/L — ABNORMAL LOW (ref 15–41)
Anion gap: 8 (ref 5–15)
BUN: 22 mg/dL — ABNORMAL HIGH (ref 6–20)
CALCIUM: 8.5 mg/dL — AB (ref 8.9–10.3)
CO2: 31 mmol/L (ref 22–32)
CREATININE: 1.44 mg/dL — AB (ref 0.61–1.24)
Chloride: 101 mmol/L (ref 101–111)
GFR calc non Af Amer: 47 mL/min — ABNORMAL LOW (ref >60)
GFR, EST AFRICAN AMERICAN: 55 mL/min — AB (ref >60)
GLUCOSE: 96 mg/dL (ref 65–99)
Potassium: 5 mmol/L (ref 3.5–5.1)
SODIUM: 140 mmol/L (ref 135–145)
Total Bilirubin: 0.2 mg/dL — ABNORMAL LOW (ref 0.3–1.2)
Total Protein: 7.1 g/dL (ref 6.5–8.1)

## 2014-12-12 LAB — I-STAT CG4 LACTIC ACID, ED: LACTIC ACID, VENOUS: 1.04 mmol/L (ref 0.5–2.0)

## 2014-12-12 MED ORDER — PIPERACILLIN-TAZOBACTAM 3.375 G IVPB
3.3750 g | Freq: Once | INTRAVENOUS | Status: AC
  Administered 2014-12-12: 3.375 g via INTRAVENOUS
  Filled 2014-12-12: qty 50

## 2014-12-12 MED ORDER — LEVOFLOXACIN 500 MG PO TABS: 500.0000 mg | ORAL_TABLET | Freq: Every day | ORAL | Status: DC

## 2014-12-12 MED ORDER — VANCOMYCIN HCL IN DEXTROSE 1-5 GM/200ML-% IV SOLN
1000.0000 mg | Freq: Once | INTRAVENOUS | Status: AC
  Administered 2014-12-12: 1000 mg via INTRAVENOUS
  Filled 2014-12-12: qty 200

## 2014-12-12 MED ORDER — IPRATROPIUM-ALBUTEROL 0.5-2.5 (3) MG/3ML IN SOLN
3.0000 mL | Freq: Once | RESPIRATORY_TRACT | Status: AC
  Administered 2014-12-12: 3 mL via RESPIRATORY_TRACT
  Filled 2014-12-12: qty 3

## 2014-12-12 MED ORDER — ALBUTEROL SULFATE HFA 108 (90 BASE) MCG/ACT IN AERS: 2.0000 | INHALATION_SPRAY | RESPIRATORY_TRACT | Status: DC | PRN

## 2014-12-12 MED ORDER — SODIUM CHLORIDE 0.9 % IV BOLUS (SEPSIS)
1000.0000 mL | Freq: Once | INTRAVENOUS | Status: AC
  Administered 2014-12-12: 1000 mL via INTRAVENOUS

## 2014-12-12 NOTE — ED Provider Notes (Signed)
TIME SEEN: 3:30 PM  CHIEF COMPLAINT: Sent from PCPs office with hypotension and hypoxia  HPI: Pt is a 74 y.o. male with history of hypertension, hyperlipidemia, prior stroke, schizophrenia who presents to the emergency department with his brother for concerns for hypotension and hypoxia has primary care provider's office today. Mother reports that he was seen by Dr. Sudie Bailey for a follow-up today as his home health nurse thought his blood pressure was low yesterday. Brother is not sure what his blood pressure was. At Dr. Michelle Nasuti office patient's brother denies that the patient wears oxygen but does have a history of COPD. He denies that the patient has had any fever, cough, vomiting or diarrhea. Patient denies any pain. He was recently admitted to the hospital for [facility] spotted fever but is no longer on antibiotics.  ROS: See HPI Constitutional: no fever  Eyes: no drainage  ENT: no runny nose   Cardiovascular:  no chest pain  Resp: no SOB  GI: no vomiting GU: no dysuria Integumentary: no rash  Allergy: no hives  Musculoskeletal: no leg swelling  Neurological: no slurred speech ROS otherwise negative  PAST MEDICAL HISTORY/PAST SURGICAL HISTORY:  Past Medical History  Diagnosis Date  . Schizophrenia   . Hypertension   . High cholesterol   . Stroke 2008  . GERD (gastroesophageal reflux disease)   . CAP (community acquired pneumonia) 11/12/2014  . Tick borne fever 11/12/2014    RMSF titer positive/elevated.     MEDICATIONS:  Prior to Admission medications   Medication Sig Start Date End Date Taking? Authorizing Provider  albuterol (PROVENTIL HFA;VENTOLIN HFA) 108 (90 BASE) MCG/ACT inhaler Inhale 2 puffs into the lungs 2 (two) times daily. 11/15/14   Elliot Cousin, MD  aspirin EC 81 MG tablet Take 81 mg by mouth daily.    Historical Provider, MD  divalproex (DEPAKOTE) 500 MG 24 hr tablet Take 1,000 mg by mouth at bedtime.     Historical Provider, MD  doxycycline  (VIBRA-TABS) 100 MG tablet Take 1 tablet (100 mg total) by mouth 2 (two) times daily. Antibiotic for the infections. 11/15/14   Elliot Cousin, MD  enalapril (VASOTEC) 10 MG tablet Take 10 mg by mouth daily.     Historical Provider, MD  LORazepam (ATIVAN) 2 MG tablet Take 2 mg by mouth 4 (four) times daily as needed. FOR ANXIETY     Historical Provider, MD  meclizine (ANTIVERT) 25 MG tablet Take 25 mg by mouth 2 (two) times daily.    Historical Provider, MD  OLANZapine (ZYPREXA) 20 MG tablet Take 20 mg by mouth at bedtime.     Historical Provider, MD  predniSONE (DELTASONE) 10 MG tablet Starting tomorrow, take 5 tablets daily for 1 day; then 4 tablets the next day for 1 day; then 3 tablets the next day for 1 day; then 2 tablets the next day for 1 day; then 1 tablet the next day; then stop. 11/15/14   Elliot Cousin, MD  PROAIR RESPICLICK 108 (90 BASE) MCG/ACT AEPB Inhale 1-2 puffs into the lungs every 6 (six) hours as needed. For s 11/17/14   Historical Provider, MD  simvastatin (ZOCOR) 40 MG tablet Take 40 mg by mouth at bedtime.      Historical Provider, MD  VESICARE 5 MG tablet Take 5 mg by mouth daily. 10/27/14   Historical Provider, MD    ALLERGIES:  No Known Allergies  SOCIAL HISTORY:  History  Substance Use Topics  . Smoking status: Current Some Day Smoker --  0.25 packs/day for 50 years    Types: Cigarettes  . Smokeless tobacco: Not on file  . Alcohol Use: No    FAMILY HISTORY: History reviewed. No pertinent family history.  EXAM: BP 96/56 mmHg  Pulse 73  Temp(Src) 98.2 F (36.8 C) (Oral)  Resp 20  Ht 5\' 9"  (1.753 m)  Wt 212 lb (96.163 kg)  BMI 31.29 kg/m2  SpO2 90% CONSTITUTIONAL: Alert and oriented and responds appropriately to questions. Well-appearing; well-nourishe, elderly, pleasant, in no distressNormocephalic EYES: Conjunctivae clear, PERRL ENT: normal nose; no rhinorrhea; moist mucous membranes; pharynx without lesions noted NECK: Supple, no meningismus, no LAD  CARD:  RRR; S1 and S2 appreciated; no murmurs, no clicks, no rubs, no gallops RESP: Normal chest excursion without splinting or tachypnea; breath sounds clear and equal bilaterally; no wheezes, no rhonchi, no rales, no hypoxia or respiratory distress, speaking full sentences ABD/GI: Normal bowel sounds; non-distended; soft, non-tender, no rebound, no guarding, no peritoneal signs BACK:  The back appears normal and is non-tender to palpation, there is no CVA tenderness EXT: Normal ROM in all joints; non-tender to palpation; no edema; normal capillary refill; no cyanosis, no calf tenderness or swelling    SKIN: Normal color for age and race; warm NEURO: Moves all extremities equally, sensation to light touch intact diffusely, cranial nerves II through XII intact PSYCH: The patient's mood and manner are appropriate. Grooming and personal hygiene are appropriate.  MEDICAL DECISION MPatient here with hypotension and hypoxia at his PCPs office. We'll monitor his blood pressure, give IV fluids.  Will check labs, cultures, urine. A chest x-ray ordered in triage shows a persistent patchy interstitial infiltrate at the left base that was seen in April and is unchanged. There is no new opacity. Suspect this is unresolved infectious pneumonia. We'll continue to closely monitor patient may need admission if he is hypotensive or hypoxic in the ED.    ED PROGRESS: Patient's blood pressure in the emergency department has been normal except for one blood pressure of 96/56. He has received 1 L of IV fluids. He has not had any hypoxia and has been able to ambulate with sats that do not drop below 93%. He has had sats as low as 90% but nothing lower than this and I suspect this is due to his chronic COPD. He is not wheezing has good aeration. He was given 1 DuoNeb today but I do not feel he needs to be on steroids. He is requesting that we discharge him home.  Labs show mild leukocytosis without left shift. Creatinine is minimally  elevated at 1.44 but this appears to be his baseline. His lactate is normal. He is not febrile. He did receive broad-spectrum antibiotics given his history of hypotension in the anticipation that he may need admission but at this time I feel he is safe to go home. Given he has persistent pneumonia on his x-ray I will discharge him with Levaquin and have him follow-up with his primary care provider closely. He does have home health nursing that comes to his house regularly. Patient's brother at bedside is also comfortable with this plan. Discussed strict return precautions. They verbalize understanding.     EKG Interpretation  Date/Time:  Friday Dec 12 2014 15:37:03 EDT Ventricular Rate:  68 PR Interval:  147 QRS Duration: 86 QT Interval:  387 QTC Calculation: 411 R Axis:   48 Text Interpretation:  Sinus rhythm Confirmed by WARD,  DO, KRISTEN ([id number]) on 12/12/2014 3:39:20 PM  Layla Maw Ward, DO 12/12/14 1949

## 2014-12-12 NOTE — Progress Notes (Signed)
ANTIBIOTIC CONSULT NOTE  Pharmacy Consult for Vancomycin & Zosyn Indication: pneumonia  No Known Allergies  Patient Measurements: Height: 5\' 9"  (175.3 cm) Weight: 212 lb (96.163 kg) IBW/kg (Calculated) : 70.7  Vital Signs: Temp: 97.7 F (36.5 C) (05/27 1550) Temp Source: Rectal (05/27 1550) BP: 110/69 mmHg (05/27 1630) Pulse Rate: 63 (05/27 1630) Intake/Output from previous day:   Intake/Output from this shift:    Labs:  Recent Labs  12/12/14 1436  WBC 11.2*  HGB 11.7*  PLT 302  CREATININE 1.44*   Estimated Creatinine Clearance: 53.1 mL/min (by C-G formula based on Cr of 1.44). No results for input(s): VANCOTROUGH, VANCOPEAK, VANCORANDOM, GENTTROUGH, GENTPEAK, GENTRANDOM, TOBRATROUGH, TOBRAPEAK, TOBRARND, AMIKACINPEAK, AMIKACINTROU, AMIKACIN in the last 72 hours.   Microbiology: No results found for this or any previous visit (from the past 720 hour(s)).  Anti-infectives    Start     Dose/Rate Route Frequency Ordered Stop   12/12/14 1600  piperacillin-tazobactam (ZOSYN) IVPB 3.375 g     3.375 g 12.5 mL/hr over 240 Minutes Intravenous  Once 12/12/14 1559     12/12/14 1600  vancomycin (VANCOCIN) IVPB 1000 mg/200 mL premix     1,000 mg 200 mL/hr over 60 Minutes Intravenous  Once 12/12/14 1559        Assessment: 74 yo M sent from PCP with decreased O2 sat and hypotension.  CXR + PNA.  Empiric, broad-spectrum antibiotics started in ED.  Goal of Therapy:  Vancomycin trough level 15-20 mcg/ml  Plan:  Vancomycin 1gm IV x1 now Zosyn 3.375gm IV x1 now F/U admission orders for subsequent doses.  Elson ClanLilliston, Cosby Proby Michelle 12/12/2014,5:33 PM

## 2014-12-12 NOTE — ED Notes (Signed)
Pt sent from PCP for hypotension (80/40), and O2 sat 86%. At triage 116/64, O2 92% on RA.

## 2014-12-12 NOTE — ED Notes (Signed)
Attempted to obtain blood when obtaining IV. Unsuccessful. Lab notified

## 2014-12-12 NOTE — Discharge Instructions (Signed)

## 2014-12-12 NOTE — ED Notes (Signed)
Ambulated with pulse ox, maintained saturation at 93%.  Stumbled twice but otherwise steady gait.

## 2014-12-17 LAB — CULTURE, BLOOD (ROUTINE X 2)
Culture: NO GROWTH
Culture: NO GROWTH

## 2015-01-02 ENCOUNTER — Other Ambulatory Visit (HOSPITAL_COMMUNITY): Payer: Self-pay | Admitting: Family Medicine

## 2015-01-02 ENCOUNTER — Ambulatory Visit (HOSPITAL_COMMUNITY)
Admission: RE | Admit: 2015-01-02 | Discharge: 2015-01-02 | Disposition: A | Discharge status: Home / Self Care | Payer: Medicare Other | Source: Ambulatory Visit | Attending: Family Medicine | Admitting: Family Medicine

## 2015-01-02 DIAGNOSIS — R9389 Abnormal findings on diagnostic imaging of other specified body structures: Secondary | ICD-10-CM

## 2015-01-02 DIAGNOSIS — R938 Abnormal findings on diagnostic imaging of other specified body structures: Secondary | ICD-10-CM | POA: Insufficient documentation

## 2015-02-27 ENCOUNTER — Ambulatory Visit (HOSPITAL_COMMUNITY)
Admission: RE | Admit: 2015-02-27 | Discharge: 2015-02-27 | Disposition: A | Discharge status: Home / Self Care | Payer: Medicare Other | Source: Ambulatory Visit | Attending: Family Medicine | Admitting: Family Medicine

## 2015-02-27 ENCOUNTER — Other Ambulatory Visit (HOSPITAL_COMMUNITY): Payer: Self-pay | Admitting: Family Medicine

## 2015-02-27 DIAGNOSIS — J189 Pneumonia, unspecified organism: Secondary | ICD-10-CM | POA: Insufficient documentation

## 2015-02-27 DIAGNOSIS — Z8701 Personal history of pneumonia (recurrent): Secondary | ICD-10-CM

## 2015-02-27 DIAGNOSIS — J449 Chronic obstructive pulmonary disease, unspecified: Secondary | ICD-10-CM | POA: Diagnosis not present

## 2015-04-01 ENCOUNTER — Inpatient Hospital Stay (HOSPITAL_COMMUNITY)
Admission: EM | Admit: 2015-04-01 | Discharge: 2015-04-03 | DRG: 602 | Disposition: A | Discharge status: Home / Self Care | Payer: Medicare Other | Attending: Family Medicine | Admitting: Family Medicine

## 2015-04-01 ENCOUNTER — Emergency Department (HOSPITAL_COMMUNITY): Payer: Medicare Other

## 2015-04-01 ENCOUNTER — Encounter (HOSPITAL_COMMUNITY): Payer: Self-pay | Admitting: *Deleted

## 2015-04-01 DIAGNOSIS — N183 Chronic kidney disease, stage 3 unspecified: Secondary | ICD-10-CM

## 2015-04-01 DIAGNOSIS — D696 Thrombocytopenia, unspecified: Secondary | ICD-10-CM

## 2015-04-01 DIAGNOSIS — Z87891 Personal history of nicotine dependence: Secondary | ICD-10-CM | POA: Diagnosis not present

## 2015-04-01 DIAGNOSIS — I10 Essential (primary) hypertension: Secondary | ICD-10-CM

## 2015-04-01 DIAGNOSIS — Z8673 Personal history of transient ischemic attack (TIA), and cerebral infarction without residual deficits: Secondary | ICD-10-CM | POA: Diagnosis not present

## 2015-04-01 DIAGNOSIS — N179 Acute kidney failure, unspecified: Secondary | ICD-10-CM | POA: Diagnosis present

## 2015-04-01 DIAGNOSIS — F209 Schizophrenia, unspecified: Secondary | ICD-10-CM | POA: Diagnosis present

## 2015-04-01 DIAGNOSIS — K219 Gastro-esophageal reflux disease without esophagitis: Secondary | ICD-10-CM | POA: Diagnosis present

## 2015-04-01 DIAGNOSIS — Z720 Tobacco use: Secondary | ICD-10-CM

## 2015-04-01 DIAGNOSIS — I129 Hypertensive chronic kidney disease with stage 1 through stage 4 chronic kidney disease, or unspecified chronic kidney disease: Secondary | ICD-10-CM | POA: Diagnosis present

## 2015-04-01 DIAGNOSIS — L03211 Cellulitis of face: Secondary | ICD-10-CM | POA: Diagnosis not present

## 2015-04-01 DIAGNOSIS — D631 Anemia in chronic kidney disease: Secondary | ICD-10-CM | POA: Diagnosis present

## 2015-04-01 DIAGNOSIS — D649 Anemia, unspecified: Secondary | ICD-10-CM

## 2015-04-01 DIAGNOSIS — E780 Pure hypercholesterolemia: Secondary | ICD-10-CM | POA: Diagnosis present

## 2015-04-01 DIAGNOSIS — J189 Pneumonia, unspecified organism: Secondary | ICD-10-CM | POA: Diagnosis not present

## 2015-04-01 DIAGNOSIS — A46 Erysipelas: Principal | ICD-10-CM | POA: Diagnosis present

## 2015-04-01 DIAGNOSIS — F17200 Nicotine dependence, unspecified, uncomplicated: Secondary | ICD-10-CM | POA: Diagnosis present

## 2015-04-01 DIAGNOSIS — R21 Rash and other nonspecific skin eruption: Secondary | ICD-10-CM | POA: Diagnosis not present

## 2015-04-01 LAB — CBC WITH DIFFERENTIAL/PLATELET
BASOS ABS: 0.1 10*3/uL (ref 0.0–0.1)
BASOS PCT: 1 %
Eosinophils Absolute: 0.4 10*3/uL (ref 0.0–0.7)
Eosinophils Relative: 4 %
HEMATOCRIT: 37 % — AB (ref 39.0–52.0)
HEMOGLOBIN: 12.1 g/dL — AB (ref 13.0–17.0)
Lymphocytes Relative: 31 %
Lymphs Abs: 3.1 10*3/uL (ref 0.7–4.0)
MCH: 31.3 pg (ref 26.0–34.0)
MCHC: 32.7 g/dL (ref 30.0–36.0)
MCV: 95.6 fL (ref 78.0–100.0)
Monocytes Absolute: 1.3 10*3/uL — ABNORMAL HIGH (ref 0.1–1.0)
Monocytes Relative: 13 %
NEUTROS PCT: 51 %
Neutro Abs: 5.1 10*3/uL (ref 1.7–7.7)
Platelets: 147 10*3/uL — ABNORMAL LOW (ref 150–400)
RBC: 3.87 MIL/uL — ABNORMAL LOW (ref 4.22–5.81)
RDW: 13.9 % (ref 11.5–15.5)
WBC: 9.9 10*3/uL (ref 4.0–10.5)

## 2015-04-01 LAB — BASIC METABOLIC PANEL
Anion gap: 5 (ref 5–15)
BUN: 26 mg/dL — ABNORMAL HIGH (ref 6–20)
CALCIUM: 8 mg/dL — AB (ref 8.9–10.3)
CO2: 29 mmol/L (ref 22–32)
Chloride: 104 mmol/L (ref 101–111)
Creatinine, Ser: 1.83 mg/dL — ABNORMAL HIGH (ref 0.61–1.24)
GFR, EST AFRICAN AMERICAN: 41 mL/min — AB (ref >60)
GFR, EST NON AFRICAN AMERICAN: 35 mL/min — AB (ref >60)
Glucose, Bld: 142 mg/dL — ABNORMAL HIGH (ref 65–99)
Potassium: 4.2 mmol/L (ref 3.5–5.1)
SODIUM: 138 mmol/L (ref 135–145)

## 2015-04-01 LAB — I-STAT CG4 LACTIC ACID, ED: LACTIC ACID, VENOUS: 1.44 mmol/L (ref 0.5–2.0)

## 2015-04-01 LAB — VALPROIC ACID LEVEL: Valproic Acid Lvl: 61 ug/mL (ref 50.0–100.0)

## 2015-04-01 MED ORDER — DOCUSATE SODIUM 100 MG PO CAPS
100.0000 mg | ORAL_CAPSULE | Freq: Two times a day (BID) | ORAL | Status: DC
  Administered 2015-04-01 – 2015-04-03 (×4): 100 mg via ORAL
  Filled 2015-04-01 (×4): qty 1

## 2015-04-01 MED ORDER — ENOXAPARIN SODIUM 40 MG/0.4ML ~~LOC~~ SOLN
40.0000 mg | SUBCUTANEOUS | Status: DC
  Administered 2015-04-01 – 2015-04-02 (×2): 40 mg via SUBCUTANEOUS
  Filled 2015-04-01 (×2): qty 0.4

## 2015-04-01 MED ORDER — ALBUTEROL SULFATE (2.5 MG/3ML) 0.083% IN NEBU
3.0000 mL | INHALATION_SOLUTION | Freq: Two times a day (BID) | RESPIRATORY_TRACT | Status: DC
  Administered 2015-04-02 – 2015-04-03 (×3): 3 mL via RESPIRATORY_TRACT
  Filled 2015-04-01 (×3): qty 3

## 2015-04-01 MED ORDER — DARIFENACIN HYDROBROMIDE ER 7.5 MG PO TB24
7.5000 mg | ORAL_TABLET | Freq: Every day | ORAL | Status: DC
  Administered 2015-04-02 – 2015-04-03 (×2): 7.5 mg via ORAL
  Filled 2015-04-01 (×2): qty 1

## 2015-04-01 MED ORDER — ASPIRIN EC 81 MG PO TBEC
81.0000 mg | DELAYED_RELEASE_TABLET | Freq: Every day | ORAL | Status: DC
  Administered 2015-04-02 – 2015-04-03 (×2): 81 mg via ORAL
  Filled 2015-04-01 (×2): qty 1

## 2015-04-01 MED ORDER — SODIUM CHLORIDE 0.9 % IV BOLUS (SEPSIS)
500.0000 mL | Freq: Once | INTRAVENOUS | Status: AC
  Administered 2015-04-01: 500 mL via INTRAVENOUS

## 2015-04-01 MED ORDER — ACETAMINOPHEN 650 MG RE SUPP: 650.0000 mg | Freq: Four times a day (QID) | RECTAL | Status: DC | PRN

## 2015-04-01 MED ORDER — DOXYCYCLINE HYCLATE 100 MG PO TABS
100.0000 mg | ORAL_TABLET | Freq: Once | ORAL | Status: AC
  Administered 2015-04-01: 100 mg via ORAL
  Filled 2015-04-01: qty 1

## 2015-04-01 MED ORDER — OLANZAPINE 5 MG PO TABS
20.0000 mg | ORAL_TABLET | Freq: Every day | ORAL | Status: DC
  Administered 2015-04-01 – 2015-04-02 (×2): 20 mg via ORAL
  Filled 2015-04-01 (×2): qty 4

## 2015-04-01 MED ORDER — DIVALPROEX SODIUM ER 500 MG PO TB24
1000.0000 mg | ORAL_TABLET | Freq: Every day | ORAL | Status: DC
  Administered 2015-04-01 – 2015-04-02 (×2): 1000 mg via ORAL
  Filled 2015-04-01 (×2): qty 2

## 2015-04-01 MED ORDER — ACETAMINOPHEN 325 MG PO TABS
650.0000 mg | ORAL_TABLET | Freq: Four times a day (QID) | ORAL | Status: DC | PRN
  Administered 2015-04-02: 650 mg via ORAL
  Filled 2015-04-01: qty 2

## 2015-04-01 MED ORDER — SIMVASTATIN 20 MG PO TABS
40.0000 mg | ORAL_TABLET | Freq: Every day | ORAL | Status: DC
  Administered 2015-04-01 – 2015-04-02 (×2): 40 mg via ORAL
  Filled 2015-04-01 (×2): qty 2

## 2015-04-01 MED ORDER — DEXTROSE 5 % IV SOLN
1.0000 g | Freq: Once | INTRAVENOUS | Status: AC
  Administered 2015-04-01: 1 g via INTRAVENOUS
  Filled 2015-04-01: qty 10

## 2015-04-01 MED ORDER — ALBUTEROL SULFATE (2.5 MG/3ML) 0.083% IN NEBU
3.0000 mL | INHALATION_SOLUTION | Freq: Two times a day (BID) | RESPIRATORY_TRACT | Status: DC
  Administered 2015-04-01: 3 mL via RESPIRATORY_TRACT
  Filled 2015-04-01: qty 3

## 2015-04-01 MED ORDER — LEVOFLOXACIN IN D5W 750 MG/150ML IV SOLN
750.0000 mg | INTRAVENOUS | Status: DC
  Administered 2015-04-01 – 2015-04-02 (×2): 750 mg via INTRAVENOUS
  Filled 2015-04-01 (×2): qty 150

## 2015-04-01 MED ORDER — MORPHINE SULFATE (PF) 2 MG/ML IV SOLN: 2.0000 mg | INTRAVENOUS | Status: DC | PRN

## 2015-04-01 MED ORDER — VANCOMYCIN HCL 10 G IV SOLR
1500.0000 mg | Freq: Once | INTRAVENOUS | Status: AC
  Administered 2015-04-01: 1500 mg via INTRAVENOUS
  Filled 2015-04-01: qty 1500

## 2015-04-01 NOTE — H&P (Signed)
[facility] History and Physical  [name]:[id number] DOB: 24-Jul-1940    PCP:   Milana Obey, MD   Chief Complaint: patient was a painful rash on his left face.  Found to have PNA incidentally.   HPI: Roger Frye is an 74 y.o. male with hx of schizophrenia on antipsychotic, HLD on statin, prior CVA, GERD, HTN, lives at home with his brother, presented to the ER with 2 days hx of redness and tenderness over his left check.  He did not complain of diplopia, fever, chills, or recall any traum a or insect bite.  He denied sorethroat, earaches, or HA. Evaluation in the ER included a normal CBC with no leukoytosis, elevated Cr to 1.83  (1.2-1.3 four months ago),  Hb of 12 grams and CXR showed likely a new infiltrate.  He was a smoker, but had quit.  He did not have significant coughs, or SOB.  No chest pain.  He was given IV Rocephin and oral Doxycycline.  Hospitalist was asked to admit him for CAP and facial cellutlitis.   He did not have any imaging of his head.   Rewiew of Systems:  Constitutional: Negative for malaise, fever and chills. No significant weight loss or weight gain Eyes: Negative for eye pain, redness and discharge, diplopia, visual changes, or flashes of light. ENMT: Negative for ear pain, hoarseness, nasal congestion, sinus pressure and sore throat. No headaches; tinnitus, drooling, or problem swallowing. Cardiovascular: Negative for chest pain, palpitations, diaphoresis, dyspnea and peripheral edema. ; No orthopnea, PND Respiratory: Negative for cough, hemoptysis, wheezing and stridor. No pleuritic chestpain. Gastrointestinal: Negative for nausea, vomiting, diarrhea, constipation, abdominal pain, melena, blood in stool, hematemesis, jaundice and rectal bleeding.    Genitourinary: Negative for frequency, dysuria, incontinence,flank pain and hematuria; Musculoskeletal: Negative for back pain and neck pain. Negative for swelling and trauma.;  Skin: . Negative for  pruritus, abrasions, bruising and skin lesion.; ulcerations Neuro: Negative for headache, lightheadedness and neck stiffness. Negative for weakness, altered level of consciousness , altered mental status, extremity weakness, burning feet, involuntary movement, seizure and syncope.  Psych: negative for anxiety, depression, insomnia, tearfulness, panic attacks, hallucinations, paranoia, suicidal or homicidal ideation    Past Medical History  Diagnosis Date  . Schizophrenia   . Hypertension   . High cholesterol   . Stroke 2008  . GERD (gastroesophageal reflux disease)   . CAP (community acquired pneumonia) 11/12/2014  . Tick borne fever 11/12/2014    RMSF titer positive/elevated.     Past Surgical History  Procedure Laterality Date  . Appendectomy    . 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 Cataract extraction w/phaco Right 10/07/2013    Procedure: CATARACT EXTRACTION PHACO AND INTRAOCULAR LENS PLACEMENT (IOC);  Surgeon: Gemma Payor, MD;  Location: [facility];  Service: Ophthalmology;  Laterality: Right;  CDE 19.32    Medications:  HOME MEDS: Prior to Admission medications   Medication Sig Start Date End Date Taking? Authorizing Provider  aspirin EC 81 MG tablet Take 81 mg by mouth daily.   Yes Historical Provider, MD  divalproex (DEPAKOTE) 500 MG 24 hr tablet Take 1,000 mg by mouth at bedtime.    Yes Historical Provider, MD  enalapril (VASOTEC) 10 MG tablet Take 10 mg by mouth daily.    Yes Historical Provider, MD  LORazepam (ATIVAN) 2 MG tablet Take 2 mg by mouth 4 (four) times daily as needed. FOR ANXIETY  Yes Historical Provider, MD  meclizine (ANTIVERT) 25 MG tablet Take 25 mg by mouth 2 (two) times daily.   Yes Historical Provider, MD  nystatin cream (MYCOSTATIN) Apply topically 2 (two) times daily. to affected area 03/25/15  Yes Historical Provider, MD  OLANZapine (ZYPREXA) 20 MG tablet Take 20 mg by mouth at bedtime.    Yes Historical  Provider, MD  PROAIR RESPICLICK 108 (90 BASE) MCG/ACT AEPB Inhale 1 puff into the lungs 2 (two) times daily. For s 11/17/14  Yes Historical Provider, MD  simvastatin (ZOCOR) 40 MG tablet Take 40 mg by mouth at bedtime.     Yes Historical Provider, MD  VESICARE 5 MG tablet Take 5 mg by mouth daily. 10/27/14  Yes Historical Provider, MD     Allergies:  No Known Allergies  Social History:   reports that he quit smoking about 4 months ago. His smoking use included Cigarettes. He has a 12.5 pack-year smoking history. He does not have any smokeless tobacco history on file. He reports that he does not drink alcohol or use illicit drugs.  Family History: History reviewed. No pertinent family history.   Physical Exam: Filed Vitals:   04/01/15 1900 04/01/15 1910 04/01/15 1930 04/01/15 2000  BP: 121/67 121/67 111/70 102/68  Pulse:  65 63 65  Temp:      TempSrc:      Resp: 21 15    Height:      Weight:      SpO2:  99% 96% 92%   Blood pressure 102/68, pulse 65, temperature 98.8 F (37.1 C), temperature source Rectal, resp. rate 15, height 5\' 9"  (1.753 m), weight 94.348 kg (208 lb), SpO2 92 %.  GEN:  Pleasant patient lying in the stretcher in no acute distress; cooperative with exam. PSYCH:  alert and oriented x4; does not appear anxious or depressed; affect is appropriate. HEENT: Mucous membranes pink and anicteric; PERRLA; EOM intact; no cervical lymphadenopathy nor thyromegaly or carotid bruit; no JVD; There were no stridor. Neck is very supple. Breasts:: Not examined CHEST WALL: No tenderness CHEST: Normal respiration, clear to auscultation bilaterally.  HEART: Regular rate and rhythm.  There are no murmur, rub, or gallops.   BACK: No kyphosis or scoliosis; no CVA tenderness ABDOMEN: soft and non-tender; no masses, no organomegaly, normal abdominal bowel sounds; no pannus; no intertriginous candida. There is no rebound and no distention. Rectal Exam: Not done EXTREMITIES: No bone or  joint deformity; age-appropriate arthropathy of the hands and knees; no edema; no ulcerations.  There is no calf tenderness. Genitalia: not examined PULSES: 2+ and symmetric SKIN: Non fluctuant, erythema on the left cheek. There is some tenderness.  No bite mark or pustule.  CNS: Cranial nerves 2-12 grossly intact no focal lateralizing neurologic deficit.  Speech is fluent; uvula elevated with phonation, facial symmetry and tongue midline. DTR are normal bilaterally, cerebella exam is intact, barbinski is negative and strengths are equaled bilaterally.  No sensory loss.   Labs on Admission:  Basic Metabolic Panel:  Recent Labs Lab 04/01/15 1731  NA 138  K 4.2  CL 104  CO2 29  GLUCOSE 142*  BUN 26*  CREATININE 1.83*  CALCIUM 8.0*   Liver Function Tests: CBC:  Recent Labs Lab 04/01/15 1731  WBC 9.9  NEUTROABS 5.1  HGB 12.1*  HCT 37.0*  MCV 95.6  PLT 147*    Radiological Exams on Admission: Dg Chest 2 View  04/01/2015   CLINICAL DATA:  Cough, wheezing, and chest congestion  since yesterdaya.  EXAM: CHEST  2 VIEW  COMPARISON:  02/27/2015  FINDINGS: New streaky opacity is seen in the left lower lobe, suspicious for bronchopneumonia. Right lung is clear. No evidence of pleural effusion. Heart size and mediastinal contours are within normal limits.  IMPRESSION: New streaky opacity in left lower lobe, suspicious for bronchopneumonia. Followup PA and lateral chest X-ray is recommended in 3-4 weeks following trial of antibiotic therapy to ensure resolution and exclude underlying malignancy.   Electronically Signed   By: Myles Rosenthal M.D.   On: 04/01/2015 18:42   Assessment/Plan Present on Admission:  . Facial cellulitis . CAP (community acquired pneumonia) . Schizophrenia . Hypertension . Tobacco use disorder . Acute kidney injury  PLAN:  Will admit him for facial cellulitis and CAP.  I agree with the ER physician that he doesn t require imaging at this time as he has no diplopia  and EOMs were intact.  It is unlikely that he has underlying facial abscess.  I would like to give him Vancomycin IV however, along with IV Levoquin for his CAP.  He is stable, and we will continue his medications.  Will admit him to a regular floor.  For his AKI, will give him IVF and hold his ACE I at this time.  Recheck his renal fx test tomorrow. Thank you for allowing me to participate in his care.   Other plans as per orders.  Code Status: FULL Unk Lightning, MD. [facility] Pager [phone] 7pm to 7am.  04/01/2015, 9:09 PM

## 2015-04-01 NOTE — ED Notes (Signed)
Pt with redness to left cheek of face since last night, denies itching or pain

## 2015-04-01 NOTE — ED Provider Notes (Signed)
CSN: [id number]     Arrival date & time 04/01/15  1635 History   First MD Initiated Contact with Patient 04/01/15 1653     Chief Complaint  Patient presents with  . Rash  . Hypotension    Patient is a 74 y.o. male presenting with rash. The history is provided by the patient and a relative.  Rash Location:  Face Facial rash location:  L cheek Severity:  Moderate Onset quality:  Gradual Duration:  1 day Timing:  Constant Progression:  Worsening Chronicity:  New Relieved by:  Nothing Exacerbated by: movement. Associated symptoms: no fever and not vomiting    PT HAS HAD REDNESS/PAIN TO LEFT CHEEK FOR PAST DAY NO TRAUMA TO FACE DENIES FEVER/VOMIITNG HE REPORTS COUGH HE DENIES ANY OTHER COMPLAINTS HE LIVES WITH BROTHER AT HOME  Past Medical History  Diagnosis Date  . Schizophrenia   . Hypertension   . High cholesterol   . Stroke 2008  . GERD (gastroesophageal reflux disease)   . CAP (community acquired pneumonia) 11/12/2014  . Tick borne fever 11/12/2014    RMSF titer positive/elevated.    Past Surgical History  Procedure Laterality Date  . Appendectomy    . 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 Cataract extraction w/phaco Right 10/07/2013    Procedure: CATARACT EXTRACTION PHACO AND INTRAOCULAR LENS PLACEMENT (IOC);  Surgeon: Gemma Payor, MD;  Location: [facility];  Service: Ophthalmology;  Laterality: Right;  CDE 19.32   History reviewed. No pertinent family history. Social History  Substance Use Topics  . Smoking status: Former Smoker -- 0.25 packs/day for 50 years    Types: Cigarettes    Quit date: 11/29/2014  . Smokeless tobacco: None  . Alcohol Use: No    Review of Systems  Constitutional: Negative for fever.  Respiratory: Positive for cough.   Gastrointestinal: Negative for vomiting.  Skin: Positive for rash.  All other systems reviewed and are negative.     Allergies  Review of patient's allergies indicates no  known allergies.  Home Medications   Prior to Admission medications   Medication Sig Start Date End Date Taking? Authorizing Provider  albuterol (PROVENTIL HFA;VENTOLIN HFA) 108 (90 BASE) MCG/ACT inhaler Inhale 2 puffs into the lungs every 4 (four) hours as needed for wheezing or shortness of breath. 12/12/14   Kristen N Ward, DO  aspirin EC 81 MG tablet Take 81 mg by mouth daily.    Historical Provider, MD  divalproex (DEPAKOTE) 500 MG 24 hr tablet Take 1,000 mg by mouth at bedtime.     Historical Provider, MD  enalapril (VASOTEC) 10 MG tablet Take 10 mg by mouth daily.     Historical Provider, MD  levofloxacin (LEVAQUIN) 500 MG tablet Take 1 tablet (500 mg total) by mouth daily. 12/12/14   Kristen N Ward, DO  LORazepam (ATIVAN) 2 MG tablet Take 2 mg by mouth 4 (four) times daily as needed. FOR ANXIETY     Historical Provider, MD  meclizine (ANTIVERT) 25 MG tablet Take 25 mg by mouth 2 (two) times daily.    Historical Provider, MD  nystatin cream (MYCOSTATIN) Apply topically 2 (two) times daily. to affected area 03/25/15   Historical Provider, MD  OLANZapine (ZYPREXA) 20 MG tablet Take 20 mg by mouth at bedtime.     Historical Provider, MD  PROAIR RESPICLICK 108 (90 BASE) MCG/ACT AEPB Inhale 1 puff into the lungs 2 (two) times daily. For s 11/17/14  Historical Provider, MD  simvastatin (ZOCOR) 40 MG tablet Take 40 mg by mouth at bedtime.      Historical Provider, MD  VESICARE 5 MG tablet Take 5 mg by mouth daily. 10/27/14   Historical Provider, MD   BP 96/59 mmHg  Pulse 79  Temp(Src) 98.8 F (37.1 C) (Rectal)  Resp 18  Ht  (1.753 m)  Wt 208 lb (94.348 kg)  BMI 30.70 kg/m2  SpO2 89% Physical Exam CONSTITUTIONAL: frail, elderly HEAD: Normocephalic/atraumatic EYES: EOMI, no proptosis ENMT: Mucous membranes moist.  He has tenderness/erythema to left maxilla. No abscess  No crepitus.  No edema noted.  He has tenderness to left gingiva but no abscess noted NECK: supple no meningeal  signs SPINE/BACK:entire spine nontender CV: S1/S2 noted LUNGS: coarse BS noted bilaterally ABDOMEN: soft, nontender, no rebound or guarding, bowel sounds noted throughout abdomen GU:no cva tenderness NEURO: Pt is awake/alert/appropriate, moves all extremitiesx4.   EXTREMITIES: pulses normal/equal, full ROM SKIN: warm, color normal PSYCH: no abnormalities of mood noted, alert and oriented to situation  ED Course  Procedures  7:52 PM Vitals improved for patient He is no longer hypotensive He does not appear septic with normal lactate He does have O2 requirement He is noted to have pneumonia as well as facial cellulitis Will need admission BP 121/67 mmHg  Pulse 65  Temp(Src) 98.8 F (37.1 C) (Rectal)  Resp 15  Ht  (1.753 m)  Wt 208 lb (94.348 kg)  BMI 30.70 kg/m2  SpO2 99%  Labs Review Labs Reviewed  BASIC METABOLIC PANEL - Abnormal; Notable for the following:    Glucose, Bld 142 (*)    BUN 26 (*)    Creatinine, Ser 1.83 (*)    Calcium 8.0 (*)    GFR calc non Af Amer 35 (*)    GFR calc Af Amer 41 (*)    All other components within normal limits  CBC WITH DIFFERENTIAL/PLATELET - Abnormal; Notable for the following:    RBC 3.87 (*)    Hemoglobin 12.1 (*)    HCT 37.0 (*)    Platelets 147 (*)    Monocytes Absolute 1.3 (*)    All other components within normal limits  I-STAT CG4 LACTIC ACID, ED    Imaging Review Dg Chest 2 View  04/01/2015   CLINICAL DATA:  Cough, wheezing, and chest congestion since yesterdaya.  EXAM: CHEST  2 VIEW  COMPARISON:  02/27/2015  FINDINGS: New streaky opacity is seen in the left lower lobe, suspicious for bronchopneumonia. Right lung is clear. No evidence of pleural effusion. Heart size and mediastinal contours are within normal limits.  IMPRESSION: New streaky opacity in left lower lobe, suspicious for bronchopneumonia. Followup PA and lateral chest X-ray is recommended in 3-4 weeks following trial of antibiotic therapy to ensure  resolution and exclude underlying malignancy.   Electronically Signed   By: Myles Rosenthal M.D.   On: 04/01/2015 18:42   I have personally reviewed and evaluated these images and lab results as part of my medical decision-making.   Medications  sodium chloride 0.9 % bolus 500 mL (0 mLs Intravenous Stopped 04/01/15 1810)  cefTRIAXone (ROCEPHIN) 1 g in dextrose 5 % 50 mL IVPB (1 g Intravenous New Bag/Given 04/01/15 1918)  doxycycline (VIBRA-TABS) tablet 100 mg (100 mg Oral Given 04/01/15 1916)    MDM   Final diagnoses:  Cellulitis of face  CAP (community acquired pneumonia)    Nursing notes including past medical history and social history reviewed  and considered in documentation xrays/imaging reviewed by myself and considered during evaluation Labs/vital reviewed myself and considered during evaluation Previous records reviewed and considered     Zadie Rhine, MD 04/01/15 1953

## 2015-04-01 NOTE — Progress Notes (Signed)
ANTIBIOTIC CONSULT NOTE-Preliminary  Pharmacy Consult for vancomycin Indication: cellulitis  No Known Allergies  Patient Measurements: Height:  (175.3 cm) Weight: 201 lb 15.1 oz (91.6 kg) IBW/kg (Calculated) : 70.7  Vital Signs: Temp: 98.6 F (37 C) (09/14 2136) Temp Source: Oral (09/14 2136) BP: 129/71 mmHg (09/14 2136) Pulse Rate: 70 (09/14 2136)  Labs:  Recent Labs  04/01/15 1731  WBC 9.9  HGB 12.1*  PLT 147*  CREATININE 1.83*    Estimated Creatinine Clearance: 40.8 mL/min (by C-G formula based on Cr of 1.83).  No results for input(s): VANCOTROUGH, VANCOPEAK, VANCORANDOM, GENTTROUGH, GENTPEAK, GENTRANDOM, TOBRATROUGH, TOBRAPEAK, TOBRARND, AMIKACINPEAK, AMIKACINTROU, AMIKACIN in the last 72 hours.   Microbiology: No results found for this or any previous visit (from the past 720 hour(s)).  Medical History: Past Medical History  Diagnosis Date  . Schizophrenia   . Hypertension   . High cholesterol   . Stroke 2008  . GERD (gastroesophageal reflux disease)   . CAP (community acquired pneumonia) 11/12/2014  . Tick borne fever 11/12/2014    RMSF titer positive/elevated.     Assessment: Ok for protocol.  Estimated Creatinine Clearance: 40.8 mL/min (by C-G formula based on Cr of 1.83).  Plan:  Preliminary review of pertinent patient information completed.  Protocol will be initiated with a one-time dose(s) of Vancomycin .   Jeani Hawking clinical pharmacist will complete review during morning rounds to assess patient and finalize treatment regimen.  Valrie Hart A, RPH 04/01/2015,10:15 PM

## 2015-04-02 DIAGNOSIS — N179 Acute kidney failure, unspecified: Secondary | ICD-10-CM

## 2015-04-02 DIAGNOSIS — D649 Anemia, unspecified: Secondary | ICD-10-CM

## 2015-04-02 DIAGNOSIS — N183 Chronic kidney disease, stage 3 unspecified: Secondary | ICD-10-CM

## 2015-04-02 DIAGNOSIS — L03211 Cellulitis of face: Secondary | ICD-10-CM

## 2015-04-02 DIAGNOSIS — D696 Thrombocytopenia, unspecified: Secondary | ICD-10-CM

## 2015-04-02 DIAGNOSIS — J189 Pneumonia, unspecified organism: Secondary | ICD-10-CM

## 2015-04-02 LAB — BASIC METABOLIC PANEL
Anion gap: 4 — ABNORMAL LOW (ref 5–15)
BUN: 22 mg/dL — AB (ref 6–20)
CALCIUM: 8 mg/dL — AB (ref 8.9–10.3)
CHLORIDE: 104 mmol/L (ref 101–111)
CO2: 32 mmol/L (ref 22–32)
CREATININE: 1.38 mg/dL — AB (ref 0.61–1.24)
GFR calc Af Amer: 57 mL/min — ABNORMAL LOW (ref >60)
GFR calc non Af Amer: 50 mL/min — ABNORMAL LOW (ref >60)
Glucose, Bld: 97 mg/dL (ref 65–99)
Potassium: 4.7 mmol/L (ref 3.5–5.1)
Sodium: 140 mmol/L (ref 135–145)

## 2015-04-02 LAB — CBC
HCT: 37.2 % — ABNORMAL LOW (ref 39.0–52.0)
Hemoglobin: 11.9 g/dL — ABNORMAL LOW (ref 13.0–17.0)
MCH: 30.4 pg (ref 26.0–34.0)
MCHC: 32 g/dL (ref 30.0–36.0)
MCV: 95.1 fL (ref 78.0–100.0)
PLATELETS: 137 10*3/uL — AB (ref 150–400)
RBC: 3.91 MIL/uL — ABNORMAL LOW (ref 4.22–5.81)
RDW: 13.7 % (ref 11.5–15.5)
WBC: 10.2 10*3/uL (ref 4.0–10.5)

## 2015-04-02 MED ORDER — SODIUM CHLORIDE 0.9 % IV SOLN
INTRAVENOUS | Status: DC
  Administered 2015-04-02: 06:00:00 via INTRAVENOUS

## 2015-04-02 MED ORDER — CETYLPYRIDINIUM CHLORIDE 0.05 % MT LIQD
7.0000 mL | Freq: Two times a day (BID) | OROMUCOSAL | Status: DC
  Administered 2015-04-02 – 2015-04-03 (×4): 7 mL via OROMUCOSAL

## 2015-04-02 MED ORDER — VANCOMYCIN HCL IN DEXTROSE 750-5 MG/150ML-% IV SOLN
750.0000 mg | Freq: Two times a day (BID) | INTRAVENOUS | Status: DC
  Administered 2015-04-02 – 2015-04-03 (×3): 750 mg via INTRAVENOUS
  Filled 2015-04-02 (×9): qty 150

## 2015-04-02 NOTE — Care Management Note (Signed)
Case Management Note  Patient Details  Name: Roger Frye MRN: [id number] Date of Birth: April 15, 1941  Expected Discharge Date:  04/03/15               Expected Discharge Plan:  Home/Self Care  In-House Referral:  NA  Discharge planning Services  CM Consult  Post Acute Care Choice:  NA Choice offered to:  NA  DME Arranged:    DME Agency:     HH Arranged:    HH Agency:     Status of Service:  Completed, signed off  Medicare Important Message Given:    Date Medicare IM Given:    Medicare IM give by:    Date Additional Medicare IM Given:    Additional Medicare Important Message give by:     If discussed at Long Length of Stay Meetings, dates discussed:    Additional Comments: Pt is from home lives with his brother who is also his CAP aid. Pt has cane and walker at home. Pt admitted for cellulitis and PNA. Pt plans to return home with brother. No CM needs noted. Will cont to follow.  Malcolm Metro, RN 04/02/2015, 11:36 AM

## 2015-04-02 NOTE — Progress Notes (Signed)
ANTIBIOTIC CONSULT NOTE - FOLLOW UP  Pharmacy Consult for Vancomycin Indication: cellulitis  No Known Allergies  Patient Measurements: Height: 5\' 9"  (175.3 cm) Weight: 201 lb 15.1 oz (91.6 kg) IBW/kg (Calculated) : 70.7  Vital Signs: Temp: 98.6 F (37 C) (09/15 0615) Temp Source: Oral (09/15 0615) BP: 119/68 mmHg (09/15 0615) Pulse Rate: 62 (09/15 0615) Intake/Output from previous day: 09/14 0701 - 09/15 0700 In: -  Out: 50 [Urine:50] Intake/Output from this shift: Total I/O In: 240 [P.O.:240] Out: -   Labs:  Recent Labs  04/01/15 1731 04/02/15 0642  WBC 9.9 10.2  HGB 12.1* 11.9*  PLT 147* 137*  CREATININE 1.83* 1.38*   Estimated Creatinine Clearance: 54.1 mL/min (by C-G formula based on Cr of 1.38). No results for input(s): VANCOTROUGH, VANCOPEAK, VANCORANDOM, GENTTROUGH, GENTPEAK, GENTRANDOM, TOBRATROUGH, TOBRAPEAK, TOBRARND, AMIKACINPEAK, AMIKACINTROU, AMIKACIN in the last 72 hours.   Microbiology: No results found for this or any previous visit (from the past 720 hour(s)).  Anti-infectives    Start     Dose/Rate Route Frequency Ordered Stop   04/02/15 1000  vancomycin (VANCOCIN) IVPB 750 mg/150 ml premix     750 mg 150 mL/hr over 60 Minutes Intravenous Every 12 hours 04/02/15 0813     04/01/15 2215  vancomycin (VANCOCIN) 1,500 mg in sodium chloride 0.9 % 500 mL IVPB     1,500 mg 250 mL/hr over 120 Minutes Intravenous  Once 04/01/15 2214 04/02/15 0049   04/01/15 2200  levofloxacin (LEVAQUIN) IVPB 750 mg     750 mg 100 mL/hr over 90 Minutes Intravenous Every 24 hours 04/01/15 2158     04/01/15 1900  cefTRIAXone (ROCEPHIN) 1 g in dextrose 5 % 50 mL IVPB     1 g 100 mL/hr over 30 Minutes Intravenous  Once 04/01/15 1859 04/01/15 1948   04/01/15 1900  doxycycline (VIBRA-TABS) tablet 100 mg     100 mg Oral  Once 04/01/15 1859 04/01/15 1916     Anti-infectives    Start     Dose/Rate Route Frequency Ordered Stop   04/02/15 1000  vancomycin (VANCOCIN) IVPB  750 mg/150 ml premix     750 mg 150 mL/hr over 60 Minutes Intravenous Every 12 hours 04/02/15 0813     04/01/15 2215  vancomycin (VANCOCIN) 1,500 mg in sodium chloride 0.9 % 500 mL IVPB     1,500 mg 250 mL/hr over 120 Minutes Intravenous  Once 04/01/15 2214 04/02/15 0049   04/01/15 2200  levofloxacin (LEVAQUIN) IVPB 750 mg     750 mg 100 mL/hr over 90 Minutes Intravenous Every 24 hours 04/01/15 2158     04/01/15 1900  cefTRIAXone (ROCEPHIN) 1 g in dextrose 5 % 50 mL IVPB     1 g 100 mL/hr over 30 Minutes Intravenous  Once 04/01/15 1859 04/01/15 1948   04/01/15 1900  doxycycline (VIBRA-TABS) tablet 100 mg     100 mg Oral  Once 04/01/15 1859 04/01/15 1916     Assessment: 74yo male presented with c/o redness and tenderness on left cheek.  CXR shows likely new infiltrate.  Pt admitted with facial cellulitis, asked to initiate Vancomycin, initial dose given on admission. Afebrile, WBC normal, SCr improved. Estimated Creatinine Clearance: 54.1 mL/min (by C-G formula based on Cr of 1.38).  Goal of Therapy:  Vancomycin trough level ~ 76mcg/ml  Plan:   Vancomycin 750mg  IV q12hrs  Check trough at steady state  Monitor labs, progress, renal fxn, and c/s  Deescalate ABX when appropriate / improved  Margo Aye, Kiyah Demartini A 04/02/2015,10:39 AM

## 2015-04-02 NOTE — Progress Notes (Signed)
PROGRESS NOTE  Roger Frye ZOX:[id number] DOB: Aug 09, 1940 DOA: 04/01/2015 PCP: Milana Obey, MD  Summary: 52 yom with history of schizophrenia presented with redness and tenderness over his left check. Evaluation in the ER suggested facial cellulitis and CXR showed likely a new infiltrate.He was given IV Rocephin and oral Doxycycline. Admitted for CAP and facial cellutlitis.   Assessment/Plan: 1. Facial cellulitis, suspect erysipelas. Improving. No evidence of complicating features.    2. CAP, improving. Afebrile, no leukocytosis, Lactic acid WNL 3. AKI, likely resolved. Will continue IVF and hold ACE-I  4. Suspected CKD stage III 5. Thrombocytopenia, suspect secondary to infection.  6. Anemia, chronic, suspect anemia of CKD/chronic disease 7. HTN 8. Schizophrenia 9. Tobacco use disorder   Appears to be improving.  Continue empiric abx.  CBC and BMP in the morning.  Likely home next 24 hours.  Code Status: Full DVT prophylaxis: Lovenox Family Communication: Brothers bedside.  Disposition Plan: Discharge home within 24 hours.  Brendia Sacks, MD  [facility]  Pager [phone] If 7PM-7AM, please contact night-coverage at [email or web address], password [facility] 04/02/2015, 7:22 AM  LOS: 1 day   Consultants:    Procedures:    Antibiotics:  Levaquin 9/14>>  Vancomycin 9/14 >>  HPI/Subjective: Feels better. No reports of chest pain, shortness of breath, n/v/d, or abd pain.  Objective: Filed Vitals:   04/01/15 2100 04/01/15 2136 04/01/15 2318 04/02/15 0615  BP: 143/84 129/71  119/68  Pulse: 63 70  62  Temp:  98.6 F (37 C)  98.6 F (37 C)  TempSrc:  Oral  Oral  Resp:  16    Height:   (1.753 m)    Weight:  91.6 kg (201 lb 15.1 oz)    SpO2: 97% 97% 96% 96%    Intake/Output Summary (Last 24 hours) at 04/02/15 0722 Last data filed at 04/02/15 0359  Gross per 24 hour  Intake      0 ml  Output     50 ml  Net    -50 ml     Filed Weights   04/01/15 1638 04/01/15 2136  Weight: 94.348 kg (208 lb) 91.6 kg (201 lb 15.1 oz)    Exam:   Afebrile, VSS, not hypoxic  General:  Appears calm and comfortable Cardiovascular: RRR, no m/r/g. No LE edema. Respiratory: CTA bilaterally, no w/r/r. Normal respiratory effort. Abdomen: soft, supraumbilical hernia that is soft and non-tender.  Skin: Confluent erythema below left eye over the zygomatic arch and cheek extending to nose, which has been marked and appears to regress slightly. Nose, non tender and no fluctuance  New data reviewed:  WBC 10.2  BMP improved, creatinine 1.38  Platelet 137  CBC without significant change  Pertinent data since admission:  CXR IMPRESSION: New streaky opacity in left lower lobe, suspicious for bronchopneumonia. Followup PA and lateral chest X-ray is recommended in 3-4 weeks following trial of antibiotic therapy to ensure resolution and exclude underlying malignancy.  Pending data:  Scheduled Meds: . albuterol  3 mL Inhalation BID  . antiseptic oral rinse  7 mL Mouth Rinse BID  . aspirin EC  81 mg Oral Daily  . darifenacin  7.5 mg Oral Daily  . divalproex  1,000 mg Oral QHS  . docusate sodium  100 mg Oral BID  . enoxaparin (LOVENOX) injection  40 mg Subcutaneous Q24H  . levofloxacin (LEVAQUIN) IV  750 mg Intravenous Q24H  . OLANZapine  20 mg Oral QHS  . simvastatin  40 mg Oral QHS  Continuous Infusions: . sodium chloride 100 mL/hr at 04/02/15 1610    Principal Problem:   Facial cellulitis Active Problems:   Schizophrenia   Hypertension   CAP (community acquired pneumonia)   Acute kidney injury   Tobacco use disorder   CKD (chronic kidney disease), stage III   Thrombocytopenia   Anemia   Time spent 25 minutes  By signing my name below, I, Zadie Cleverly attest that this documentation has been prepared under the direction and in the presence of Brendia Sacks, MD Electronically signed: Zadie Cleverly  04/02/2015   I  have reviewed the above documentation for accuracy and completeness, and I agree with the above. Brendia Sacks, MD

## 2015-04-03 LAB — BASIC METABOLIC PANEL
Anion gap: 5 (ref 5–15)
BUN: 17 mg/dL (ref 6–20)
CHLORIDE: 108 mmol/L (ref 101–111)
CO2: 29 mmol/L (ref 22–32)
CREATININE: 1.2 mg/dL (ref 0.61–1.24)
Calcium: 8.2 mg/dL — ABNORMAL LOW (ref 8.9–10.3)
GFR calc Af Amer: >60 mL/min (ref >60)
GFR calc non Af Amer: 59 mL/min — ABNORMAL LOW (ref >60)
GLUCOSE: 89 mg/dL (ref 65–99)
Potassium: 4.4 mmol/L (ref 3.5–5.1)
Sodium: 142 mmol/L (ref 135–145)

## 2015-04-03 MED ORDER — LEVOFLOXACIN 750 MG PO TABS: 750.0000 mg | ORAL_TABLET | Freq: Every day | ORAL | Status: DC

## 2015-04-03 NOTE — Care Management Important Message (Signed)
Important Message  Patient Details  Name: Roger Frye MRN: [id number] Date of Birth: 09/11/1940   Medicare Important Message Given:  N/A - LOS <3 / Initial given by admissions    Cheryl Flash, RN 04/03/2015, 12:09 PM

## 2015-04-03 NOTE — Progress Notes (Signed)
PROGRESS NOTE  Roger Frye:[id number] DOB: 02-10-1941 DOA: 04/01/2015 PCP: Milana Obey, MD  Summary: 27 yom with history of schizophrenia presented with redness and tenderness over his left cheek. Evaluation in the ER suggested facial cellulitis and CXR showed likely a new infiltrate.He was given IV Rocephin and oral Doxycycline. Admitted for CAP and facial cellutlitis.   Assessment/Plan: 1. Facial cellulitis, suspect erysipelas. Rapidly improved. No evidence of complicating features.    2. CAP,appears clinically resolved, no hypoxia. Remained afebrile. 3. AKI, resolved.  4. Suspected CKD stage II-III. 5. Thrombocytopenia, suspect secondary to infection.  6. Anemia, chronic, suspect anemia of CKD/chronic disease 7. HTN, stable.  8. Schizophrenia 9. Tobacco use disorder   Overall much improved. Facial cellulitis rapidly resolving. No evidence of complicating features. Change to oral abx--given concomitant CAP with Rx monotherapy with Levaquin. No clinical features or history to suggest MRSA. This appears to erysipelas.  Discharge home today with outpatient follow-up.   Code Status: Full DVT prophylaxis: Lovenox Family Communication: Brother bedside. Discussed with patient who understands and has no concerns at this time. Disposition Plan: Discharge home  Brendia Sacks, MD  [facility]  Pager [phone] If 7PM-7AM, please contact night-coverage at [email or web address], password [facility] 04/03/2015, 7:11 AM  LOS: 2 days   Consultants:    Procedures:    Antibiotics:  Levaquin 9/14>>9/20  Vancomycin 9/14 >>9/16  HPI/Subjective: Feeling good, was able to eat without difficulty and denies any pain in his face. No reports of chest pain, shortness of breath, n/v/d, or abd pain.  Objective: Filed Vitals:   04/02/15 1253 04/02/15 2015 04/02/15 2054 04/03/15 0624  BP: 116/68  105/68 129/79  Pulse: 109  68 62  Temp: 97.4 F (36.3 C)  98.8 F (37.1 C) 98.5 F  (36.9 C)  TempSrc: Oral  Oral Oral  Resp: Height:      Weight:      SpO2: 100% 96% 96% 93%    Intake/Output Summary (Last 24 hours) at 04/03/15 0711 Last data filed at 04/03/15 0640  Gross per 24 hour  Intake 1578.33 ml  Output    500 ml  Net 1078.33 ml     Filed Weights   04/01/15 1638 04/01/15 2136  Weight: 94.348 kg (208 lb) 91.6 kg (201 lb 15.1 oz)    Exam: Afebrile, VSS not hypoxic General:  Appears comfortable, calm. Sitting up in bed about to eat lunch.  Eyes: PERRL, normal lids, irises ENT: grossly normal hearing, lips, tongue Cardiovascular: Regular rate and rhythm, no murmur, rub or gallop. No lower extremity edema. Respiratory: Clear to auscultation bilaterally, no wheezes, rales or rhonchi. Normal respiratory effort. Skin: Erythema on left side of face continues to regress from marked border, less swelling, non-tender to palpation no fluctuance noted, much improved overall. EOMI. No lid involvement. Musculoskeletal: grossly normal tone bilateral upper and lower extremities Psychiatric: grossly normal mood and affect, speech fluent and appropriate  New data reviewed:  BMP unremarkable   Pertinent data since admission:  CXR IMPRESSION: New streaky opacity in left lower lobe, suspicious for bronchopneumonia. Followup PA and lateral chest X-ray is recommended in 3-4 weeks following trial of antibiotic therapy to ensure resolution and exclude underlying malignancy.  Pending data:  Scheduled Meds: . albuterol  3 mL Inhalation BID  . antiseptic oral rinse  7 mL Mouth Rinse BID  . aspirin EC  81 mg Oral Daily  . darifenacin  7.5 mg Oral Daily  . divalproex  1,000 mg  Oral QHS  . docusate sodium  100 mg Oral BID  . enoxaparin (LOVENOX) injection  40 mg Subcutaneous Q24H  . levofloxacin (LEVAQUIN) IV  750 mg Intravenous Q24H  . OLANZapine  20 mg Oral QHS  . simvastatin  40 mg Oral QHS  . vancomycin  750 mg Intravenous Q12H   Continuous  Infusions: . sodium chloride 100 mL/hr at 04/02/15 9562    Principal Problem:   Facial cellulitis Active Problems:   Schizophrenia   Hypertension   CAP (community acquired pneumonia)   Acute kidney injury   Tobacco use disorder   CKD (chronic kidney disease), stage III   Thrombocytopenia   Anemia   By signing my name below, I, Zadie Cleverly attest that this documentation has been prepared under the direction and in the presence of Brendia Sacks, MD Electronically signed: Zadie Cleverly  04/03/2015   I have reviewed the above documentation for accuracy and completeness, and I agree with the above. Brendia Sacks, MD

## 2015-04-03 NOTE — Progress Notes (Signed)
Pt's IV catheter removed and intact. Pt's IV site clean dry and intact. Discharge instructions, medications, and follow up appointments reviewed and discussed with patient's brother Leonette Most. All questions were asked and no further questions at this time. Pt in stable condition and in no acute distress at time of discharge. Pt escorted by nurse tech.

## 2015-04-03 NOTE — Care Management Note (Signed)
Case Management Note  Patient Details  Name: Roger Frye MRN: [id number] Date of Birth: 1941-06-21  Subjective/Objective:                    Action/Plan:   Expected Discharge Date:  04/03/15               Expected Discharge Plan:  Home/Self Care  In-House Referral:  NA  Discharge planning Services  CM Consult  Post Acute Care Choice:  NA Choice offered to:  NA  DME Arranged:    DME Agency:     HH Arranged:    HH Agency:     Status of Service:  Completed, signed off  Medicare Important Message Given:    Date Medicare IM Given:    Medicare IM give by:    Date Additional Medicare IM Given:    Additional Medicare Important Message give by:     If discussed at Long Length of Stay Meetings, dates discussed:    Additional Comments: Pt discharged home today. No CM needs noted. Arlyss Queen Killen, RN 04/03/2015, 12:09 PM

## 2015-04-03 NOTE — Discharge Summary (Signed)
Physician Discharge Summary  Roger Frye ZOX:[id number] DOB: 01-Nov-1940 DOA: 04/01/2015  PCP: Milana Obey, MD  Admit date: 04/01/2015 Discharge date: 04/03/2015  Recommendations for Outpatient Follow-up:   Follow up with PCP for resolution of facial cellulitis and pneumonia.   Follow-up thrombocytopenia, suspect will resolve with treatment for infection    Follow-up Information    Follow up with Milana Obey, MD. Schedule an appointment as soon as possible for a visit in 1 week.   Specialty:  Family Medicine   Contact information:   [address] [place] [address] [phone]       Discharge Diagnoses:  1. Facial cellulitis, suspect erysipelas. 2. CAP 3. AKI. 4. Suspected CKD stage III. 5. Thrombocytopenia. 6. Anemia, chronic, suspect anemia of CKD/chronic disease 7. HTN 8. Schizophrenia 9. Tobacco use disorder  Discharge Condition: Improved  Disposition: discharge home   Diet recommendation: regular  Filed Weights   04/01/15 1638 04/01/15 2136  Weight: 94.348 kg (208 lb) 91.6 kg (201 lb 15.1 oz)    History of present illness:  39 yom with history of schizophrenia presented with redness and tenderness over his left check. Evaluation in the ER suggested facial cellulitis and CXR showed likely a new infiltrate.He was given IV Rocephin and oral Doxycycline. Admitted for CAP and facial cellutlitis.  Hospital Course:  Rapid improvement in facial cellulitis with treatment of antibiotics. Features most suggestive of erysipelas. CAP clinically resolved, no hypoxia. Thrombocytopenia, suspected to be secondary to infection, remained stable. There were no evidence of complication features for the facial cellulitis and he remained afebrile, with no leukocytosis and lactic acid WNL. AKI and resolved with IVF.   Individual issues as below:  1. Facial cellulitis, suspect erysipelas. Rapidly improved. No evidence of complicating features.   2. CAP,appears clinically resolved, no hypoxia. Remained afebrile. 3. AKI, resolved.  4. Suspected CKD stage II-III. 5. Thrombocytopenia, suspect secondary to infection.  6. Anemia, chronic, suspect anemia of CKD/chronic disease 7. HTN, stable.  8. Schizophrenia 9. Tobacco use disorder   Facial cellulitis rapidly resolving. No evidence of complicating features. Change to oral abx--given concomitant CAP with Rx monotherapy with Levaquin. No clinical features or history to suggest MRSA. This appears to erysipelas.  Consultants:  None  Procedures:  None  Antibiotics:  Levaquin 9/14>>9/20  Vancomycin 9/14 >>9/16   Discharge Instructions Discharge Instructions    Activity as tolerated - No restrictions    Complete by:  As directed      Diet general    Complete by:  As directed      Discharge instructions    Complete by:  As directed   Call your physician or seek immediate medical attention for pain, increased redness, swelling, fever or worsening of condition.            Discharge Medication List as of 04/03/2015 12:36 PM    START taking these medications   Details  levofloxacin (LEVAQUIN) 750 MG tablet Take 1 tablet (750 mg total) by mouth daily. Take each evening, Starting 04/03/2015, Until Discontinued, Normal      CONTINUE these medications which have NOT CHANGED   Details  aspirin EC 81 MG tablet Take 81 mg by mouth daily., Until Discontinued, Historical Med    divalproex (DEPAKOTE) 500 MG 24 hr tablet Take 1,000 mg by mouth at bedtime. , Until Discontinued, Historical Med    enalapril (VASOTEC) 10 MG tablet Take 10 mg by mouth daily. , Until Discontinued, Historical Med    LORazepam (ATIVAN)  2 MG tablet Take 2 mg by mouth 4 (four) times daily as needed. FOR ANXIETY , Until Discontinued, Historical Med    meclizine (ANTIVERT) 25 MG tablet Take 25 mg by mouth 2 (two) times daily., Until Discontinued, Historical Med    nystatin cream (MYCOSTATIN) Apply  topically 2 (two) times daily. to affected area, Starting 03/25/2015, Until Discontinued, Historical Med    OLANZapine (ZYPREXA) 20 MG tablet Take 20 mg by mouth at bedtime. , Until Discontinued, Historical Med    PROAIR RESPICLICK 108 (90 BASE) MCG/ACT AEPB Inhale 1 puff into the lungs 2 (two) times daily. For s, Starting 11/17/2014, Until Discontinued, Historical Med    simvastatin (ZOCOR) 40 MG tablet Take 40 mg by mouth at bedtime.  , Until Discontinued, Historical Med    VESICARE 5 MG tablet Take 5 mg by mouth daily., Starting 10/27/2014, Until Discontinued, Historical Med       No Known Allergies  The results of significant diagnostics from this hospitalization (including imaging, microbiology, ancillary and laboratory) are listed below for reference.    Significant Diagnostic Studies: Dg Chest 2 View  04/01/2015   CLINICAL DATA:  Cough, wheezing, and chest congestion since yesterdaya.  EXAM: CHEST  2 VIEW  COMPARISON:  02/27/2015  FINDINGS: New streaky opacity is seen in the left lower lobe, suspicious for bronchopneumonia. Right lung is clear. No evidence of pleural effusion. Heart size and mediastinal contours are within normal limits.  IMPRESSION: New streaky opacity in left lower lobe, suspicious for bronchopneumonia. Followup PA and lateral chest X-ray is recommended in 3-4 weeks following trial of antibiotic therapy to ensure resolution and exclude underlying malignancy.   Electronically Signed   By: Myles Rosenthal M.D.   On: 04/01/2015 18:42     Labs: Basic Metabolic Panel:  Recent Labs Lab 04/01/15 1731 04/02/15 0642 04/03/15 0529  NA 138 140 142  K 4.2 4.7 4.4  CL 104 104 108  CO2 29 32 29  GLUCOSE 142* 97 89  BUN 26* 22* 17  CREATININE 1.83* 1.38* 1.20  CALCIUM 8.0* 8.0* 8.2*   CBC:  Recent Labs Lab 04/01/15 1731 04/02/15 0642  WBC 9.9 10.2  NEUTROABS 5.1  --   HGB 12.1* 11.9*  HCT 37.0* 37.2*  MCV 95.6 95.1  PLT 147* 137*    Principal Problem:   Facial  cellulitis Active Problems:   Schizophrenia   Hypertension   CAP (community acquired pneumonia)   Acute kidney injury   Tobacco use disorder   CKD (chronic kidney disease), stage III   Thrombocytopenia   Anemia   Time coordinating discharge: 35 minutes   Signed:  Brendia Sacks, MD [facility] 04/03/2015, 11:39 AM     By signing my name below, I, Zadie Cleverly attest that this documentation has been prepared under the direction and in the presence of Brendia Sacks, MD Electronically signed: Zadie Cleverly  04/03/2015   I have reviewed the above documentation for accuracy and completeness, and I agree with the above. Brendia Sacks, MD

## 2015-09-29 IMAGING — DX DG CHEST 2V
2 series · 2 of 2 positions shown · non-contrast
Comparison: November 12, 2014

CLINICAL DATA: Hypotension

EXAM:
CHEST  2 VIEW

[chest pa]
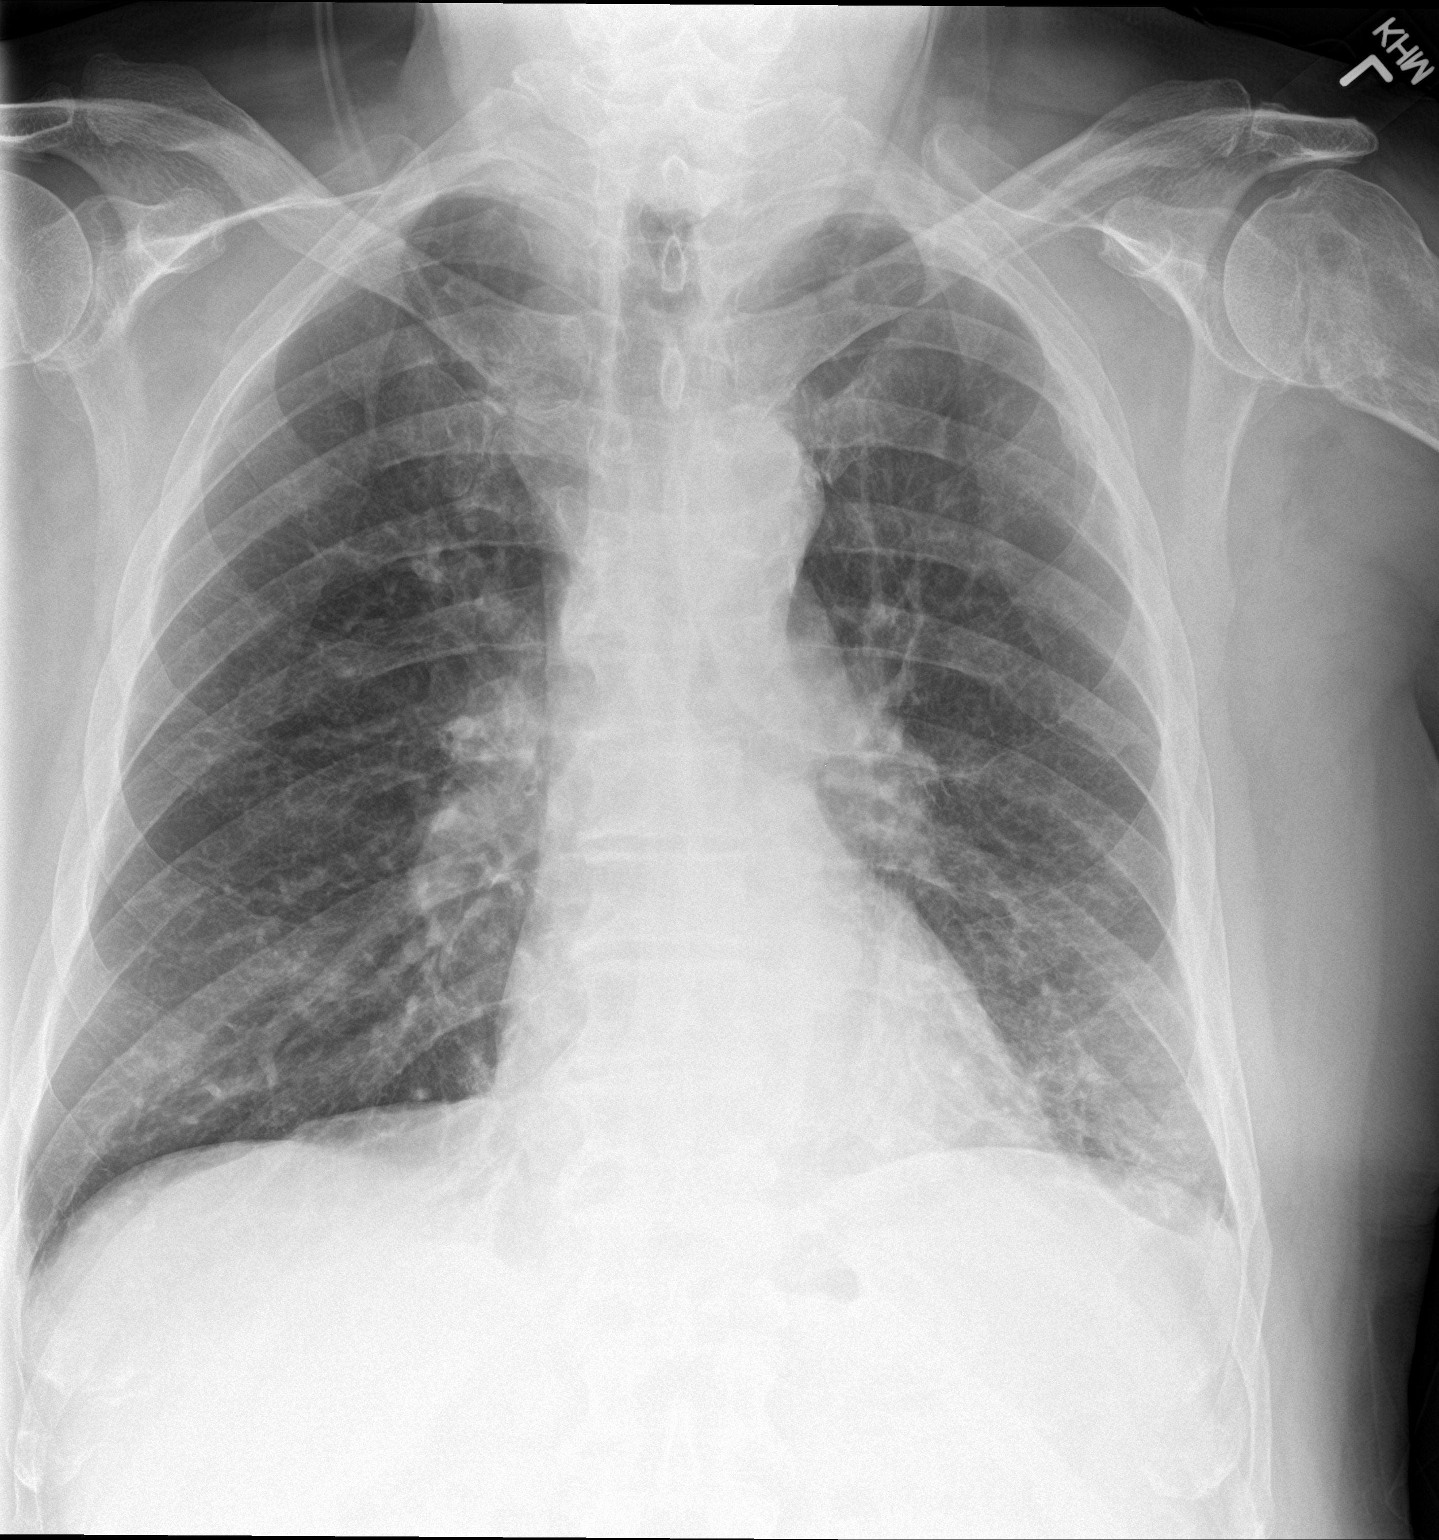

[chest lat]
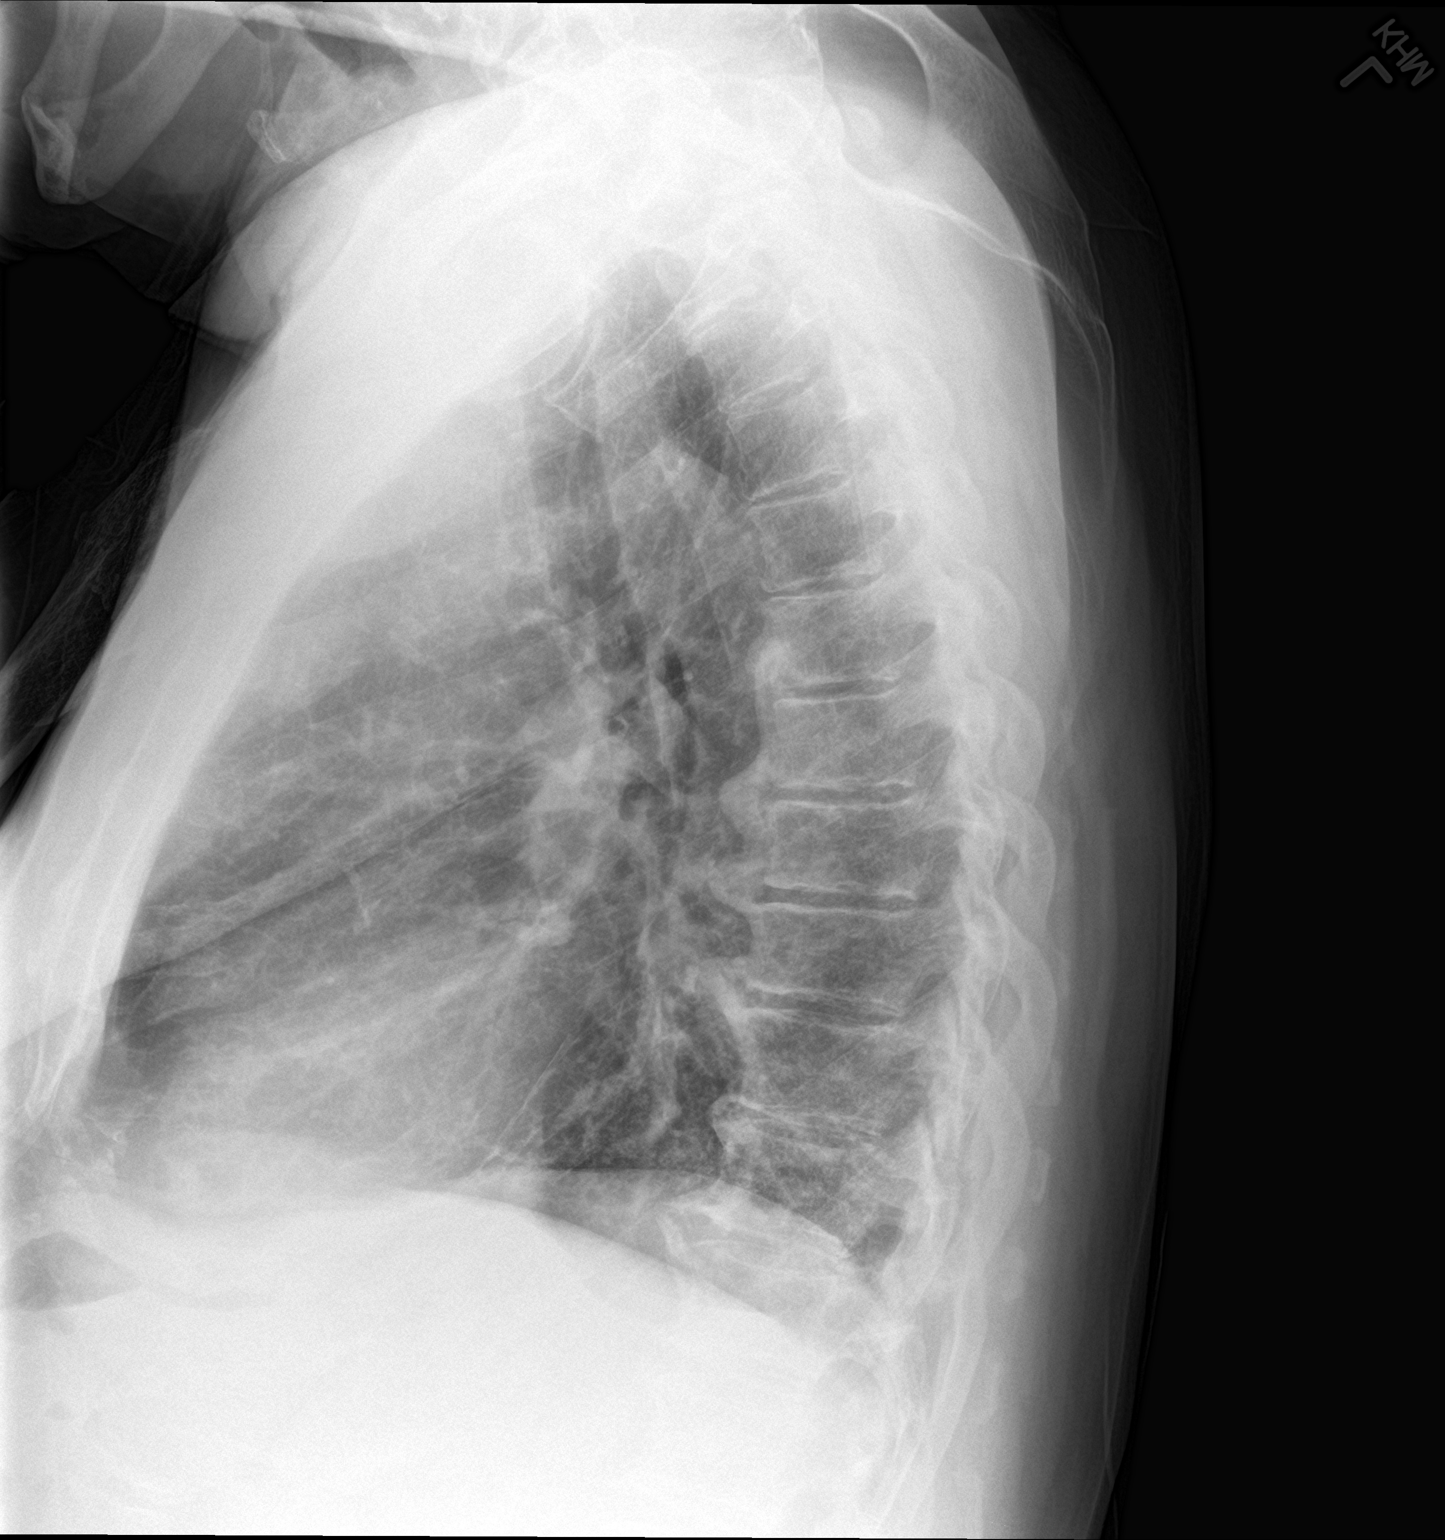

[2 of 2 positions shown; findings below may reference images not displayed]

FINDINGS: There is persistent patchy predominantly interstitial infiltrate in
the left base. Lungs elsewhere are clear. Heart size and pulmonary
vascularity are normal. No adenopathy. There is atherosclerotic
change in the aortic arch region. There is degenerative change in
the thoracic spine.
IMPRESSION: Persistent patchy predominantly interstitial infiltrate left base.
No new opacity. The etiology for persistence of the infiltrate is
uncertain. Suspect unresolved infectious pneumonia. In the
appropriate clinical setting, recurrent aspiration could present in
this manner.

## 2015-10-13 ENCOUNTER — Emergency Department (HOSPITAL_COMMUNITY): Payer: Medicare Other

## 2015-10-13 ENCOUNTER — Encounter (HOSPITAL_COMMUNITY): Payer: Self-pay | Admitting: Emergency Medicine

## 2015-10-13 ENCOUNTER — Emergency Department (HOSPITAL_COMMUNITY)
Admission: EM | Admit: 2015-10-13 | Discharge: 2015-10-13 | Disposition: A | Discharge status: Home / Self Care | Payer: Medicare Other | Attending: Emergency Medicine | Admitting: Emergency Medicine

## 2015-10-13 DIAGNOSIS — R1033 Periumbilical pain: Secondary | ICD-10-CM | POA: Insufficient documentation

## 2015-10-13 DIAGNOSIS — Z8673 Personal history of transient ischemic attack (TIA), and cerebral infarction without residual deficits: Secondary | ICD-10-CM | POA: Diagnosis not present

## 2015-10-13 DIAGNOSIS — I10 Essential (primary) hypertension: Secondary | ICD-10-CM | POA: Insufficient documentation

## 2015-10-13 DIAGNOSIS — R52 Pain, unspecified: Secondary | ICD-10-CM

## 2015-10-13 DIAGNOSIS — Z8719 Personal history of other diseases of the digestive system: Secondary | ICD-10-CM | POA: Diagnosis not present

## 2015-10-13 DIAGNOSIS — Z791 Long term (current) use of non-steroidal anti-inflammatories (NSAID): Secondary | ICD-10-CM | POA: Diagnosis not present

## 2015-10-13 DIAGNOSIS — F209 Schizophrenia, unspecified: Secondary | ICD-10-CM | POA: Diagnosis not present

## 2015-10-13 DIAGNOSIS — Z87891 Personal history of nicotine dependence: Secondary | ICD-10-CM | POA: Insufficient documentation

## 2015-10-13 DIAGNOSIS — R109 Unspecified abdominal pain: Secondary | ICD-10-CM

## 2015-10-13 LAB — COMPREHENSIVE METABOLIC PANEL
ALT: 12 U/L — ABNORMAL LOW (ref 17–63)
AST: 17 U/L (ref 15–41)
Albumin: 3.2 g/dL — ABNORMAL LOW (ref 3.5–5.0)
Alkaline Phosphatase: 69 U/L (ref 38–126)
Anion gap: 7 (ref 5–15)
BILIRUBIN TOTAL: 0.4 mg/dL (ref 0.3–1.2)
BUN: 21 mg/dL — AB (ref 6–20)
CHLORIDE: 103 mmol/L (ref 101–111)
CO2: 29 mmol/L (ref 22–32)
CREATININE: 1.52 mg/dL — AB (ref 0.61–1.24)
Calcium: 8.4 mg/dL — ABNORMAL LOW (ref 8.9–10.3)
GFR, EST AFRICAN AMERICAN: 51 mL/min — AB (ref >60)
GFR, EST NON AFRICAN AMERICAN: 44 mL/min — AB (ref >60)
Glucose, Bld: 98 mg/dL (ref 65–99)
POTASSIUM: 4.9 mmol/L (ref 3.5–5.1)
SODIUM: 139 mmol/L (ref 135–145)
TOTAL PROTEIN: 7.6 g/dL (ref 6.5–8.1)

## 2015-10-13 LAB — CBC
HEMATOCRIT: 40.5 % (ref 39.0–52.0)
Hemoglobin: 12.8 g/dL — ABNORMAL LOW (ref 13.0–17.0)
MCH: 29.8 pg (ref 26.0–34.0)
MCHC: 31.6 g/dL (ref 30.0–36.0)
MCV: 94.4 fL (ref 78.0–100.0)
PLATELETS: 205 10*3/uL (ref 150–400)
RBC: 4.29 MIL/uL (ref 4.22–5.81)
RDW: 13.1 % (ref 11.5–15.5)
WBC: 10.2 10*3/uL (ref 4.0–10.5)

## 2015-10-13 LAB — LIPASE, BLOOD: LIPASE: 27 U/L (ref 11–51)

## 2015-10-13 NOTE — ED Provider Notes (Signed)
CSN: [id number]     Arrival date & time 10/13/15  1308 History   First MD Initiated Contact with Patient 10/13/15 1558     Chief Complaint  Patient presents with  . Abdominal Pain     (Consider location/radiation/quality/duration/timing/severity/associated sxs/prior Treatment) Patient is a 75 y.o. male presenting with abdominal pain. The history is provided by the patient (Patient complains of abdominal pain earlier today but not hurting now he has a history of umbilical hernia).  Abdominal Pain Pain location:  Periumbilical Pain quality: aching   Pain radiates to:  Does not radiate Pain severity:  Mild Onset quality:  Sudden Timing:  Unable to specify Progression:  Resolved Chronicity:  Recurrent Context: alcohol use   Associated symptoms: no chest pain, no cough, no diarrhea, no fatigue and no hematuria     Past Medical History  Diagnosis Date  . Schizophrenia (HCC)   . Hypertension   . High cholesterol   . Stroke [facility]) 2008  . GERD (gastroesophageal reflux disease)   . CAP (community acquired pneumonia) 11/12/2014  . Tick borne fever 11/12/2014    RMSF titer positive/elevated.    Past Surgical History  Procedure Laterality Date  . Appendectomy    . 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 Cataract extraction w/phaco Right 10/07/2013    Procedure: CATARACT EXTRACTION PHACO AND INTRAOCULAR LENS PLACEMENT (IOC);  Surgeon: Gemma Payor, MD;  Location: [facility];  Service: Ophthalmology;  Laterality: Right;  CDE 19.32   No family history on file. Social History  Substance Use Topics  . Smoking status: Former Smoker -- 0.25 packs/day for 50 years    Types: Cigarettes    Quit date: 11/29/2014  . Smokeless tobacco: None  . Alcohol Use: No    Review of Systems  Constitutional: Negative for appetite change and fatigue.  HENT: Negative for congestion, ear discharge and sinus pressure.   Eyes: Negative for discharge.  Respiratory:  Negative for cough.   Cardiovascular: Negative for chest pain.  Gastrointestinal: Positive for abdominal pain. Negative for diarrhea.  Genitourinary: Negative for frequency and hematuria.  Musculoskeletal: Negative for back pain.  Skin: Negative for rash.  Neurological: Negative for seizures and headaches.  Psychiatric/Behavioral: Negative for hallucinations.      Allergies  Review of patient's allergies indicates no known allergies.  Home Medications   Prior to Admission medications   Medication Sig Start Date End Date Taking? Authorizing Provider  aspirin EC 81 MG tablet Take 81 mg by mouth daily.    Historical Provider, MD  divalproex (DEPAKOTE) 500 MG 24 hr tablet Take 1,000 mg by mouth at bedtime.     Historical Provider, MD  enalapril (VASOTEC) 10 MG tablet Take 10 mg by mouth daily.     Historical Provider, MD  levofloxacin (LEVAQUIN) 750 MG tablet Take 1 tablet (750 mg total) by mouth daily. Take each evening 04/03/15   Standley Brooking, MD  LORazepam (ATIVAN) 2 MG tablet Take 2 mg by mouth 4 (four) times daily as needed. FOR ANXIETY     Historical Provider, MD  meclizine (ANTIVERT) 25 MG tablet Take 25 mg by mouth 2 (two) times daily.    Historical Provider, MD  nystatin cream (MYCOSTATIN) Apply topically 2 (two) times daily. to affected area 03/25/15   Historical Provider, MD  OLANZapine (ZYPREXA) 20 MG tablet Take 20 mg by mouth at bedtime.     Historical Provider, MD  PROAIR RESPICLICK 108 (  90 BASE) MCG/ACT AEPB Inhale 1 puff into the lungs 2 (two) times daily. For s 11/17/14   Historical Provider, MD  simvastatin (ZOCOR) 40 MG tablet Take 40 mg by mouth at bedtime.      Historical Provider, MD  VESICARE 5 MG tablet Take 5 mg by mouth daily. 10/27/14   Historical Provider, MD   BP 134/95 mmHg  Pulse 71  Temp(Src) 98.2 F (36.8 C) (Oral)  Resp 16  Ht 5\' 9"  (1.753 m)  Wt 212 lb (96.163 kg)  BMI 31.29 kg/m2  SpO2 93% Physical Exam  Constitutional: He is oriented to  person, place, and time. He appears well-developed.  HENT:  Head: Normocephalic.  Eyes: Conjunctivae and EOM are normal. No scleral icterus.  Neck: Neck supple. No thyromegaly present.  Cardiovascular: Normal rate and regular rhythm.  Exam reveals no gallop and no friction rub.   No murmur heard. Pulmonary/Chest: No stridor. He has no wheezes. He has no rales. He exhibits no tenderness.  Abdominal: He exhibits no distension. There is no tenderness. There is no rebound.  Patient has a nontender reducible umbilical hernia  Musculoskeletal: Normal range of motion. He exhibits no edema.  Lymphadenopathy:    He has no cervical adenopathy.  Neurological: He is oriented to person, place, and time. He exhibits normal muscle tone. Coordination normal.  Skin: No rash noted. No erythema.  Psychiatric: He has a normal mood and affect. His behavior is normal.    ED Course  Procedures (including critical care time) Labs Review Labs Reviewed  COMPREHENSIVE METABOLIC PANEL - Abnormal; Notable for the following:    BUN 21 (*)    Creatinine, Ser 1.52 (*)    Calcium 8.4 (*)    Albumin 3.2 (*)    ALT 12 (*)    GFR calc non Af Amer 44 (*)    GFR calc Af Amer 51 (*)    All other components within normal limits  CBC - Abnormal; Notable for the following:    Hemoglobin 12.8 (*)    All other components within normal limits  LIPASE, BLOOD    Imaging Review Dg Chest 2 View  10/13/2015  CLINICAL DATA:  Cough and chest pain EXAM: CHEST  2 VIEW COMPARISON:  April 01, 2015 FINDINGS: There is slight scarring in the bases. There is no edema or consolidation. Heart size and pulmonary vascularity are normal. No adenopathy. There is atherosclerotic calcification in aorta. Degenerative changes noted in the thoracic spine. IMPRESSION: Mild scarring in the lung bases.  No edema or consolidation. Electronically Signed   By: Bretta BangWilliam  Woodruff III M.D.   On: 10/13/2015 13:40   Dg Abd 2 Views  10/13/2015   CLINICAL DATA:  Sudden onset of generalized abdominal pain this morning. EXAM: ABDOMEN - 2 VIEW COMPARISON:  CT 02/04/2009 FINDINGS: The bowel gas pattern is normal. There is no evidence of free air. No radio-opaque calculi or other significant radiographic abnormality is seen. Mild degenerative changes in the lower lumbar spine and hips. Left basilar opacity likely reflects atelectasis or scarring. IMPRESSION: No acute findings in the abdomen. Electronically Signed   By: Charlett NoseKevin  Dover M.D.   On: 10/13/2015 16:53   I have personally reviewed and evaluated these images and lab results as part of my medical decision-making.   EKG Interpretation None      MDM   Final diagnoses:  Pain  Abdominal pain in male    Patient had abdominal pain earlier today but no pain now. The  pain could've been related to his umbilical hernia. He has had this for years according to his son. Patient will follow-up with his PCP. He was told to return if the pain is back and will not go away or the hernia comes out and is very painful    Bethann Berkshire, MD 10/13/15 [phone]

## 2015-10-13 NOTE — ED Notes (Signed)
Pt with cough in Triage, CXR ordered per protocol.

## 2015-10-13 NOTE — Discharge Instructions (Signed)
Follow up with your md if problems °

## 2015-10-13 NOTE — ED Notes (Signed)
Pt reports sudden onset of abdominal pain this am, resolved now.

## 2015-10-13 NOTE — ED Notes (Signed)
Pt requested to give urine when available.

## 2016-01-17 IMAGING — DX DG CHEST 2V
2 series · 2 of 2 positions shown · non-contrast
Comparison: 02/27/2015

CLINICAL DATA: Cough, wheezing, and chest congestion since
yesterdaya.

EXAM:
CHEST  2 VIEW

[chest lat]
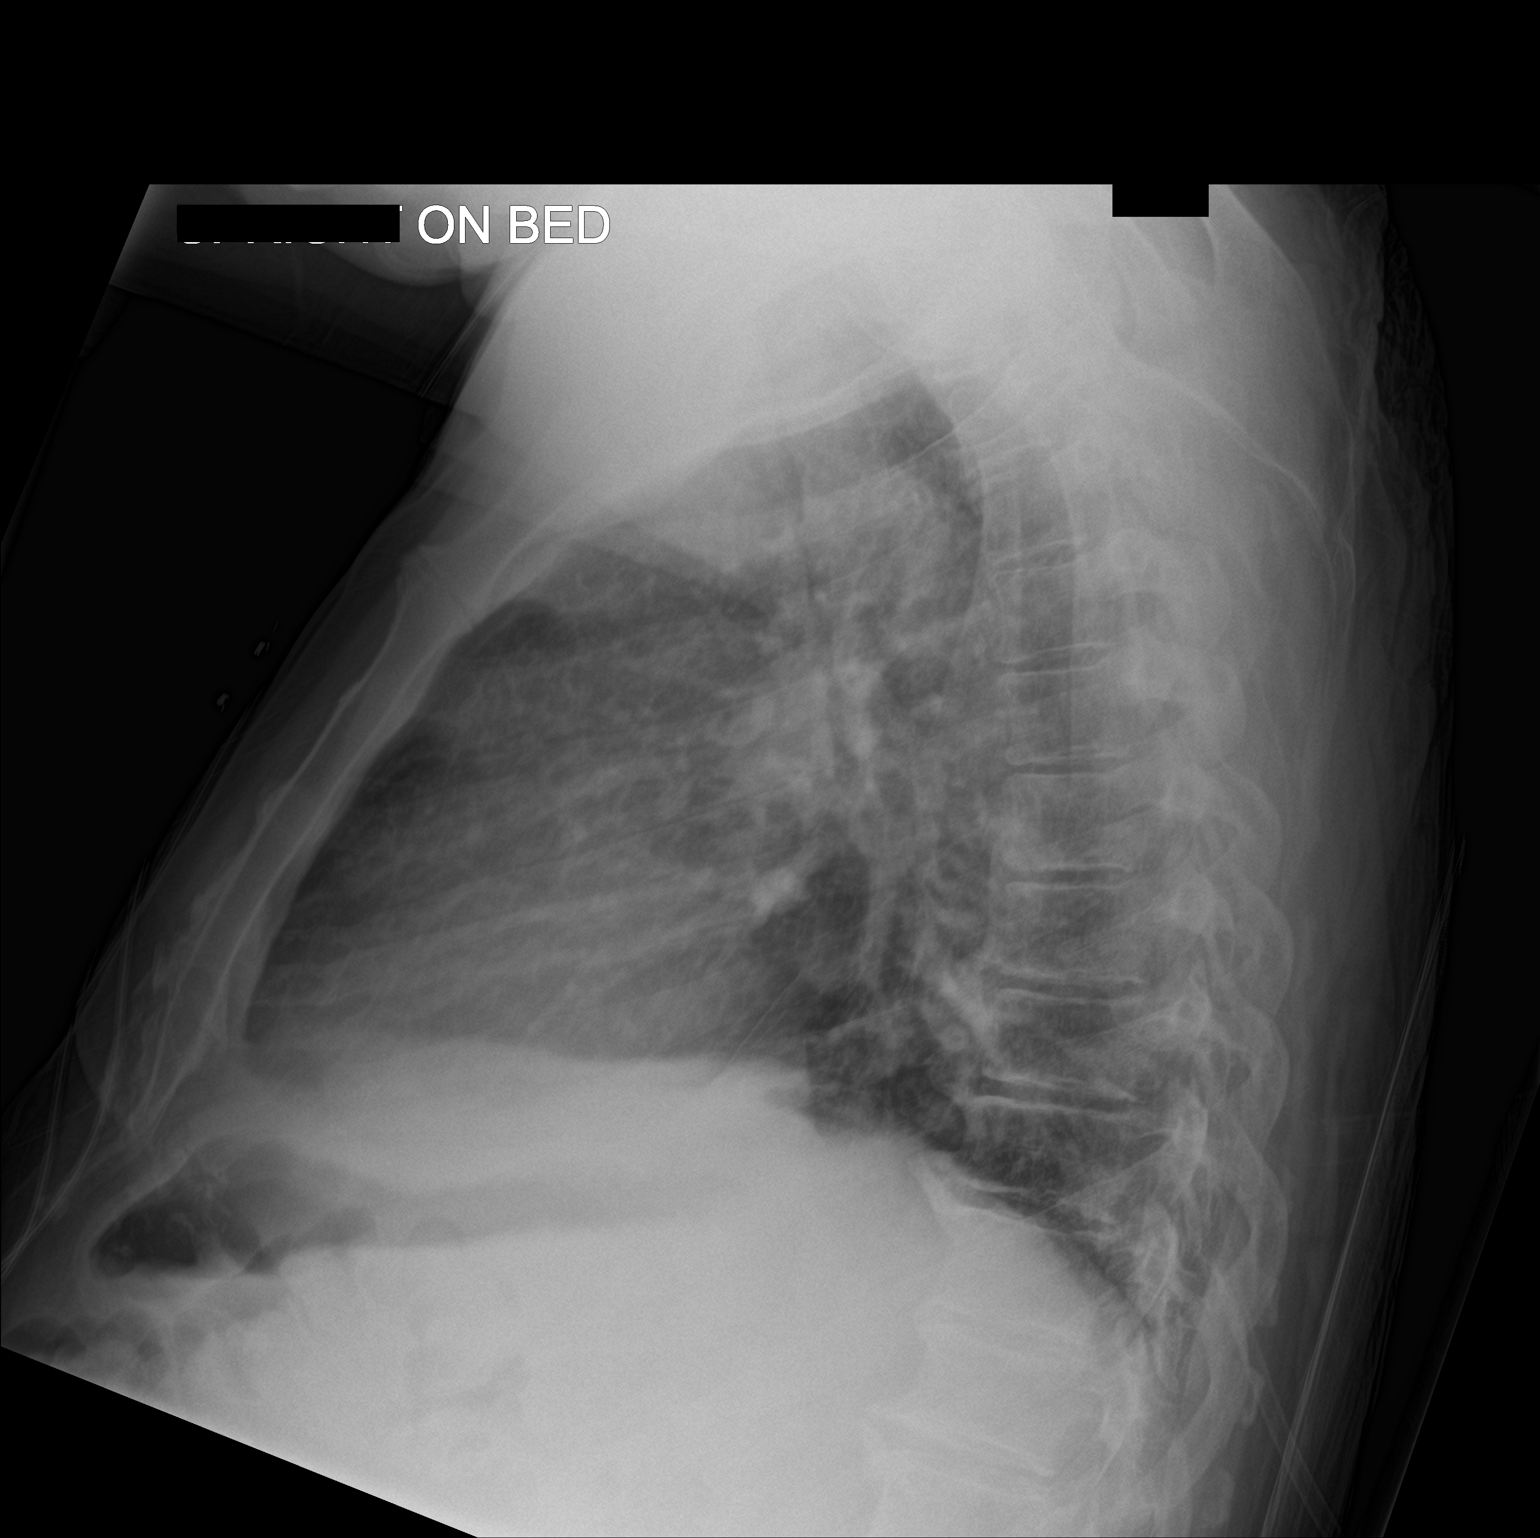

[chest ap]
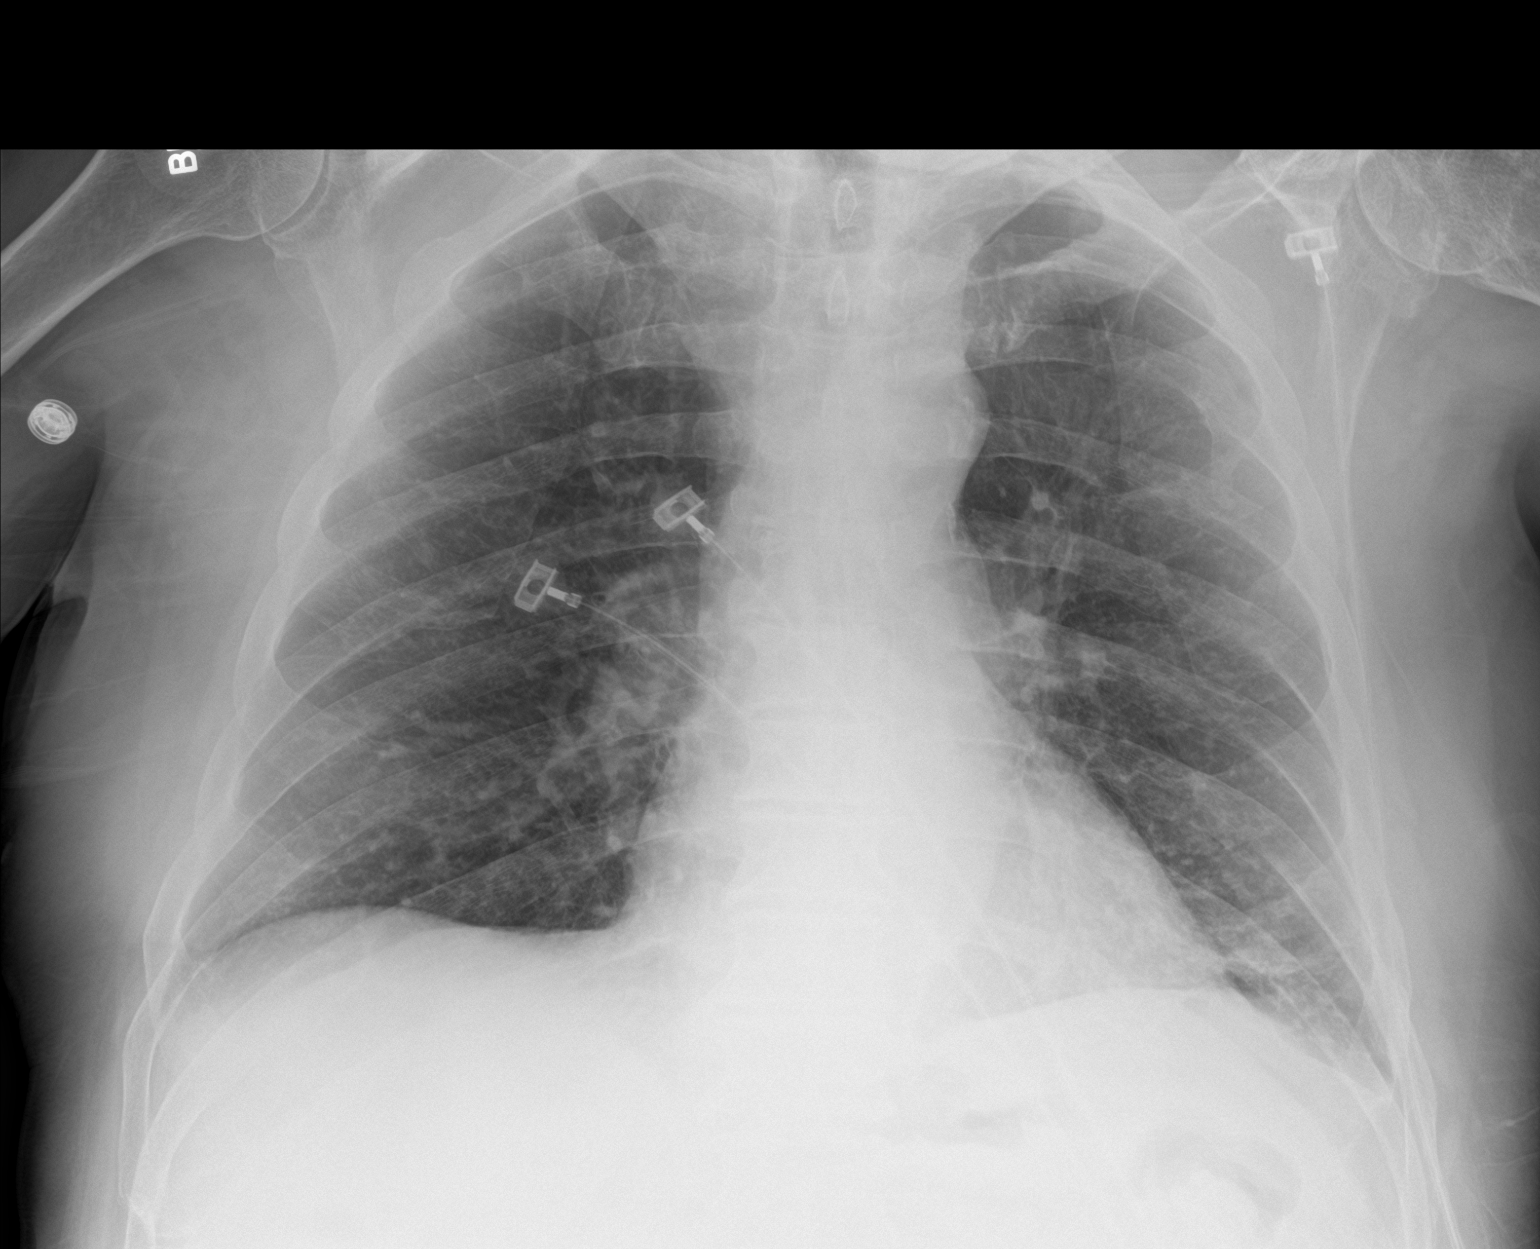

[2 of 2 positions shown; findings below may reference images not displayed]

FINDINGS: New streaky opacity is seen in the left lower lobe, suspicious for
bronchopneumonia. Right lung is clear. No evidence of pleural
effusion. Heart size and mediastinal contours are within normal
limits.
IMPRESSION: New streaky opacity in left lower lobe, suspicious for
bronchopneumonia. Followup PA and lateral chest X-ray is recommended
in 3-4 weeks following trial of antibiotic therapy to ensure
resolution and exclude underlying malignancy.

## 2016-02-09 ENCOUNTER — Ambulatory Visit (HOSPITAL_COMMUNITY)
Admission: RE | Admit: 2016-02-09 | Discharge: 2016-02-09 | Disposition: A | Discharge status: Home / Self Care | Payer: Medicare Other | Source: Ambulatory Visit | Attending: Family Medicine | Admitting: Family Medicine

## 2016-02-09 ENCOUNTER — Other Ambulatory Visit (HOSPITAL_COMMUNITY): Payer: Self-pay | Admitting: Family Medicine

## 2016-02-09 DIAGNOSIS — R918 Other nonspecific abnormal finding of lung field: Secondary | ICD-10-CM | POA: Insufficient documentation

## 2016-02-09 DIAGNOSIS — R0689 Other abnormalities of breathing: Secondary | ICD-10-CM

## 2016-02-09 DIAGNOSIS — R0989 Other specified symptoms and signs involving the circulatory and respiratory systems: Secondary | ICD-10-CM | POA: Insufficient documentation

## 2016-02-17 ENCOUNTER — Other Ambulatory Visit (HOSPITAL_COMMUNITY): Payer: Self-pay | Admitting: Family Medicine

## 2016-02-17 DIAGNOSIS — J449 Chronic obstructive pulmonary disease, unspecified: Secondary | ICD-10-CM

## 2016-02-24 ENCOUNTER — Ambulatory Visit (HOSPITAL_COMMUNITY)
Admission: RE | Admit: 2016-02-24 | Discharge: 2016-02-24 | Disposition: A | Discharge status: Home / Self Care | Payer: Medicare Other | Source: Ambulatory Visit | Attending: Family Medicine | Admitting: Family Medicine

## 2016-02-24 DIAGNOSIS — J449 Chronic obstructive pulmonary disease, unspecified: Secondary | ICD-10-CM | POA: Insufficient documentation

## 2016-02-24 DIAGNOSIS — I700 Atherosclerosis of aorta: Secondary | ICD-10-CM | POA: Insufficient documentation

## 2016-02-24 DIAGNOSIS — Z87891 Personal history of nicotine dependence: Secondary | ICD-10-CM | POA: Diagnosis present

## 2016-02-24 DIAGNOSIS — I2510 Atherosclerotic heart disease of native coronary artery without angina pectoris: Secondary | ICD-10-CM | POA: Diagnosis not present

## 2016-02-24 LAB — POCT I-STAT CREATININE: Creatinine, Ser: 1.5 mg/dL — ABNORMAL HIGH (ref 0.61–1.24)

## 2016-02-24 MED ORDER — IOPAMIDOL (ISOVUE-300) INJECTION 61%
75.0000 mL | Freq: Once | INTRAVENOUS | Status: AC | PRN
  Administered 2016-02-24: 75 mL via INTRAVENOUS

## 2016-08-10 ENCOUNTER — Emergency Department (HOSPITAL_COMMUNITY): Payer: Medicare Other

## 2016-08-10 ENCOUNTER — Inpatient Hospital Stay (HOSPITAL_COMMUNITY)
Admission: EM | Admit: 2016-08-10 | Discharge: 2016-08-13 | DRG: 193 | Disposition: A | Discharge status: Home / Self Care | Payer: Medicare Other | Attending: Internal Medicine | Admitting: Internal Medicine

## 2016-08-10 ENCOUNTER — Encounter (HOSPITAL_COMMUNITY): Payer: Self-pay | Admitting: Emergency Medicine

## 2016-08-10 DIAGNOSIS — Z961 Presence of intraocular lens: Secondary | ICD-10-CM | POA: Diagnosis present

## 2016-08-10 DIAGNOSIS — E039 Hypothyroidism, unspecified: Secondary | ICD-10-CM | POA: Diagnosis present

## 2016-08-10 DIAGNOSIS — Z79899 Other long term (current) drug therapy: Secondary | ICD-10-CM | POA: Diagnosis not present

## 2016-08-10 DIAGNOSIS — Z8673 Personal history of transient ischemic attack (TIA), and cerebral infarction without residual deficits: Secondary | ICD-10-CM | POA: Diagnosis not present

## 2016-08-10 DIAGNOSIS — K219 Gastro-esophageal reflux disease without esophagitis: Secondary | ICD-10-CM | POA: Diagnosis present

## 2016-08-10 DIAGNOSIS — J189 Pneumonia, unspecified organism: Secondary | ICD-10-CM | POA: Diagnosis present

## 2016-08-10 DIAGNOSIS — N183 Chronic kidney disease, stage 3 unspecified: Secondary | ICD-10-CM | POA: Diagnosis present

## 2016-08-10 DIAGNOSIS — Z87891 Personal history of nicotine dependence: Secondary | ICD-10-CM | POA: Diagnosis not present

## 2016-08-10 DIAGNOSIS — I129 Hypertensive chronic kidney disease with stage 1 through stage 4 chronic kidney disease, or unspecified chronic kidney disease: Secondary | ICD-10-CM | POA: Diagnosis present

## 2016-08-10 DIAGNOSIS — J9601 Acute respiratory failure with hypoxia: Secondary | ICD-10-CM | POA: Diagnosis present

## 2016-08-10 DIAGNOSIS — F209 Schizophrenia, unspecified: Secondary | ICD-10-CM | POA: Diagnosis present

## 2016-08-10 DIAGNOSIS — E7800 Pure hypercholesterolemia, unspecified: Secondary | ICD-10-CM | POA: Diagnosis present

## 2016-08-10 DIAGNOSIS — N179 Acute kidney failure, unspecified: Secondary | ICD-10-CM

## 2016-08-10 DIAGNOSIS — Z9841 Cataract extraction status, right eye: Secondary | ICD-10-CM

## 2016-08-10 DIAGNOSIS — A419 Sepsis, unspecified organism: Secondary | ICD-10-CM

## 2016-08-10 DIAGNOSIS — I10 Essential (primary) hypertension: Secondary | ICD-10-CM

## 2016-08-10 DIAGNOSIS — M6281 Muscle weakness (generalized): Secondary | ICD-10-CM

## 2016-08-10 DIAGNOSIS — R2689 Other abnormalities of gait and mobility: Secondary | ICD-10-CM

## 2016-08-10 DIAGNOSIS — Z7982 Long term (current) use of aspirin: Secondary | ICD-10-CM | POA: Diagnosis not present

## 2016-08-10 DIAGNOSIS — J181 Lobar pneumonia, unspecified organism: Secondary | ICD-10-CM

## 2016-08-10 LAB — CBC WITH DIFFERENTIAL/PLATELET
BASOS ABS: 0.1 10*3/uL (ref 0.0–0.1)
BASOS PCT: 0 %
Eosinophils Absolute: 0.2 10*3/uL (ref 0.0–0.7)
Eosinophils Relative: 1 %
HEMATOCRIT: 39.9 % (ref 39.0–52.0)
Hemoglobin: 13 g/dL (ref 13.0–17.0)
Lymphocytes Relative: 9 %
Lymphs Abs: 1.3 10*3/uL (ref 0.7–4.0)
MCH: 30.6 pg (ref 26.0–34.0)
MCHC: 32.6 g/dL (ref 30.0–36.0)
MCV: 93.9 fL (ref 78.0–100.0)
MONO ABS: 0.9 10*3/uL (ref 0.1–1.0)
Monocytes Relative: 6 %
Neutro Abs: 11.9 10*3/uL — ABNORMAL HIGH (ref 1.7–7.7)
Neutrophils Relative %: 84 %
PLATELETS: 217 10*3/uL (ref 150–400)
RBC: 4.25 MIL/uL (ref 4.22–5.81)
RDW: 13.4 % (ref 11.5–15.5)
WBC: 14.4 10*3/uL — ABNORMAL HIGH (ref 4.0–10.5)

## 2016-08-10 LAB — URINALYSIS, ROUTINE W REFLEX MICROSCOPIC
Bilirubin Urine: NEGATIVE
Glucose, UA: 50 mg/dL — AB
Hgb urine dipstick: NEGATIVE
Ketones, ur: NEGATIVE mg/dL
LEUKOCYTES UA: NEGATIVE
NITRITE: NEGATIVE
PH: 7 (ref 5.0–8.0)
Protein, ur: NEGATIVE mg/dL
SPECIFIC GRAVITY, URINE: 1.018 (ref 1.005–1.030)

## 2016-08-10 LAB — COMPREHENSIVE METABOLIC PANEL
ALBUMIN: 3.3 g/dL — AB (ref 3.5–5.0)
ALK PHOS: 77 U/L (ref 38–126)
ALT: 9 U/L — ABNORMAL LOW (ref 17–63)
ANION GAP: 8 (ref 5–15)
AST: 17 U/L (ref 15–41)
BILIRUBIN TOTAL: 0.4 mg/dL (ref 0.3–1.2)
BUN: 22 mg/dL — ABNORMAL HIGH (ref 6–20)
CALCIUM: 8.2 mg/dL — AB (ref 8.9–10.3)
CO2: 27 mmol/L (ref 22–32)
Chloride: 98 mmol/L — ABNORMAL LOW (ref 101–111)
Creatinine, Ser: 1.61 mg/dL — ABNORMAL HIGH (ref 0.61–1.24)
GFR calc non Af Amer: 41 mL/min — ABNORMAL LOW (ref >60)
GFR, EST AFRICAN AMERICAN: 47 mL/min — AB (ref >60)
GLUCOSE: 177 mg/dL — AB (ref 65–99)
Potassium: 4.9 mmol/L (ref 3.5–5.1)
Sodium: 133 mmol/L — ABNORMAL LOW (ref 135–145)
TOTAL PROTEIN: 7.7 g/dL (ref 6.5–8.1)

## 2016-08-10 LAB — INFLUENZA PANEL BY PCR (TYPE A & B)
INFLBPCR: NEGATIVE
Influenza A By PCR: NEGATIVE

## 2016-08-10 LAB — I-STAT CG4 LACTIC ACID, ED: Lactic Acid, Venous: 2.85 mmol/L (ref 0.5–1.9)

## 2016-08-10 MED ORDER — SODIUM CHLORIDE 0.9 % IV BOLUS (SEPSIS)
1000.0000 mL | Freq: Once | INTRAVENOUS | Status: AC
  Administered 2016-08-10: 1000 mL via INTRAVENOUS

## 2016-08-10 MED ORDER — DEXTROSE 5 % IV SOLN
500.0000 mg | Freq: Once | INTRAVENOUS | Status: AC
  Administered 2016-08-10: 500 mg via INTRAVENOUS
  Filled 2016-08-10: qty 500

## 2016-08-10 MED ORDER — ACETAMINOPHEN 325 MG PO TABS
650.0000 mg | ORAL_TABLET | Freq: Once | ORAL | Status: AC
  Administered 2016-08-10: 650 mg via ORAL
  Filled 2016-08-10: qty 2

## 2016-08-10 MED ORDER — CEFTRIAXONE SODIUM 1 G IJ SOLR
1.0000 g | Freq: Once | INTRAMUSCULAR | Status: AC
  Administered 2016-08-10: 1 g via INTRAVENOUS
  Filled 2016-08-10: qty 10

## 2016-08-10 NOTE — H&P (Signed)
History and Physical    Roger Frye:[id number] DOB: 15-Jun-1941 DOA: 08/10/2016  PCP: Milana Obey, MD  Patient coming from: Home.    Chief Complaint:  Feeling weak, having fever at home, found to have PNA.   HPI: Roger Frye is an 76 y.o. male with hx of prior CAP, schizophrenia, HTN, HLD, hx of prior stroke, lives at home with his older brother, brought to the ER as he was feeling weak, and unable to walk.  He was found to have a low grade fever of 100.4.  He has a non productive coughs, but denied diffuse myalgia.  Work up in the ER showed leukocytosis with WBC of 14K, CXR showed LLL infiltrate.  His Cr was 1.6, and BS was 177.  BC were obtained, and he was given IV Rocephin and Zithromax for CAP.  Hospitalist was asked to admit him for same.   ED Course:  See above.  Rewiew of Systems:  Constitutional: Negative for malaise, fever and chills. No significant weight loss or weight gain Eyes: Negative for eye pain, redness and discharge, diplopia, visual changes, or flashes of light. ENMT: Negative for ear pain, hoarseness, nasal congestion, sinus pressure and sore throat. No headaches; tinnitus, drooling, or problem swallowing. Cardiovascular: Negative for chest pain, palpitations, diaphoresis, dyspnea and peripheral edema. ; No orthopnea, PND Respiratory: Negative for hemoptysis, wheezing and stridor. No pleuritic chestpain. Gastrointestinal: Negative for diarrhea, constipation,  melena, blood in stool, hematemesis, jaundice and rectal bleeding.    Genitourinary: Negative for frequency, dysuria, incontinence,flank pain and hematuria; Musculoskeletal: Negative for back pain and neck pain. Negative for swelling and trauma.;  Skin: . Negative for pruritus, rash, abrasions, bruising and skin lesion.; ulcerations Neuro: Negative for headache, lightheadedness and neck stiffness. Negative for weakness, altered level of consciousness , altered mental status, extremity weakness, burning  feet, involuntary movement, seizure and syncope.  Psych: negative for anxiety, depression, insomnia, tearfulness, panic attacks, hallucinations, paranoia, suicidal or homicidal ideation    Past Medical History:  Diagnosis Date  . CAP (community acquired pneumonia) 11/12/2014  . GERD (gastroesophageal reflux disease)   . High cholesterol   . Hypertension   . Schizophrenia (HCC)   . Stroke [facility]) 2008  . Tick borne fever 11/12/2014   RMSF titer positive/elevated.      Past Surgical History:  Procedure Laterality Date  . APPENDECTOMY    . CATARACT EXTRACTION W/PHACO Right 10/07/2013   Procedure: CATARACT EXTRACTION PHACO AND INTRAOCULAR LENS PLACEMENT (IOC);  Surgeon: Gemma Payor, MD;  Location: [facility];  Service: Ophthalmology;  Laterality: Right;  CDE 19.32  . 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 His smoking use included Cigarettes. He has a 12.50 pack-year smoking history. He has never used smokeless tobacco. He reports that he does not drink alcohol or use drugs.  No Known Allergies  History reviewed. No pertinent family history.   Prior to Admission medications   Medication Sig Start Date End Date Taking? Authorizing Provider  aspirin EC 81 MG tablet Take 81 mg by mouth daily.    Historical Provider, MD  divalproex (DEPAKOTE) 500 MG 24 hr tablet Take 1,000 mg by mouth at bedtime.     Historical Provider, MD  enalapril (VASOTEC) 10 MG tablet Take 10 mg by mouth daily.     Historical Provider, MD  levofloxacin (LEVAQUIN) 750 MG tablet Take 1 tablet (  750 mg total) by mouth daily. Take each evening 04/03/15   Standley Brookinganiel P Goodrich, MD  LORazepam (ATIVAN) 2 MG tablet Take 2 mg by mouth 4 (four) times daily as needed. FOR ANXIETY     Historical Provider, MD  meclizine (ANTIVERT) 25 MG tablet Take 25 mg by mouth 2 (two) times daily.    Historical Provider, MD  nystatin cream (MYCOSTATIN) Apply  topically 2 (two) times daily. to affected area 03/25/15   Historical Provider, MD  OLANZapine (ZYPREXA) 20 MG tablet Take 20 mg by mouth at bedtime.     Historical Provider, MD  PROAIR RESPICLICK 108 (90 BASE) MCG/ACT AEPB Inhale 1 puff into the lungs 2 (two) times daily. For s 11/17/14   Historical Provider, MD  simvastatin (ZOCOR) 40 MG tablet Take 40 mg by mouth at bedtime.      Historical Provider, MD  VESICARE 5 MG tablet Take 5 mg by mouth daily. 10/27/14   Historical Provider, MD    Physical Exam: Vitals:   08/10/16 2130 08/10/16 2228 08/10/16 2230 08/10/16 2300  BP: 134/67  138/70 135/64  Pulse: 101  96 91  Resp: 20  23 24   Temp:  98.6 F (37 C)    TempSrc:  Oral    SpO2: 95%  92% 91%      Constitutional: NAD, calm, comfortable Vitals:   08/10/16 2130 08/10/16 2228 08/10/16 2230 08/10/16 2300  BP: 134/67  138/70 135/64  Pulse: 101  96 91  Resp: 20  23 24   Temp:  98.6 F (37 C)    TempSrc:  Oral    SpO2: 95%  92% 91%   Eyes: PERRL, lids and conjunctivae normal ENMT: Mucous membranes are moist. Posterior pharynx clear of any exudate or lesions.Normal dentition.  Neck: normal, supple, no masses, no thyromegaly Respiratory: rhonchi heard over the LLL area with no wheezing, no crackles. Normal respiratory effort. No accessory muscle use.  Cardiovascular: Regular rate and rhythm, no murmurs / rubs / gallops. No extremity edema. 2+ pedal pulses. No carotid bruits.  Abdomen: no tenderness, no masses palpated. No hepatosplenomegaly. Bowel sounds positive.  Musculoskeletal: no clubbing / cyanosis. No joint deformity upper and lower extremities. Good ROM, no contractures. Normal muscle tone.  Skin: no rashes, lesions, ulcers. No induration Neurologic: CN 2-12 grossly intact. Sensation intact, DTR normal. Strength 5/5 in all 4.  Psychiatric: Normal judgment and insight. Alert and oriented x 3. Normal mood.     Labs on Admission: I have personally reviewed following labs and  imaging studies  CBC:  Recent Labs Lab 08/10/16 2058  WBC 14.4*  NEUTROABS 11.9*  HGB 13.0  HCT 39.9  MCV 93.9  PLT 217   Basic Metabolic Panel:  Recent Labs Lab 08/10/16 2058  NA 133*  K 4.9  CL 98*  CO2 27  GLUCOSE 177*  BUN 22*  CREATININE 1.61*  CALCIUM 8.2*   GFR: Liver Function Tests:  Recent Labs Lab 08/10/16 2058  AST 17  ALT 9*  ALKPHOS 77  BILITOT 0.4  PROT 7.7  ALBUMIN 3.3*   Urine analysis:    Component Value Date/Time   COLORURINE YELLOW 08/10/2016 2058   APPEARANCEUR CLEAR 08/10/2016 2058   LABSPEC 1.018 08/10/2016 2058   PHURINE 7.0 08/10/2016 2058   GLUCOSEU 50 (A) 08/10/2016 2058   HGBUR NEGATIVE 08/10/2016 2058   BILIRUBINUR NEGATIVE 08/10/2016 2058   KETONESUR NEGATIVE 08/10/2016 2058   PROTEINUR NEGATIVE 08/10/2016 2058   UROBILINOGEN 0.2 12/12/2014 1830   NITRITE  NEGATIVE 08/10/2016 2058   LEUKOCYTESUR NEGATIVE 08/10/2016 2058    Recent Results (from the past 240 hour(s))  Blood Culture (routine x 2)     Status: None (Preliminary result)   Collection Time: 08/10/16  9:10 PM  Result Value Ref Range Status   Specimen Description LEFT ANTECUBITAL  Final   Special Requests BOTTLES DRAWN AEROBIC AND ANAEROBIC 6CC  Final   Culture PENDING  Incomplete   Report Status PENDING  Incomplete  Blood Culture (routine x 2)     Status: None (Preliminary result)   Collection Time: 08/10/16  9:27 PM  Result Value Ref Range Status   Specimen Description LEFT ANTECUBITAL  Final   Special Requests BOTTLES DRAWN AEROBIC AND ANAEROBIC 6CC  Final   Culture PENDING  Incomplete   Report Status PENDING  Incomplete     Radiological Exams on Admission: Dg Chest 2 View  Result Date: 08/10/2016 CLINICAL DATA:  Patient presents with recent chills. Former smoker and hypertension. EXAM: CHEST  2 VIEW COMPARISON:  02/09/2016 CXR, 02/24/2016 chest CT FINDINGS: Heart is top-normal in size. The aorta is atherosclerotic at its arch. Mild diffuse  interstitial prominence may reflect mild interstitial edema. Slightly more confluent left lower lobe airspace opacities are suspicious for left lower lobe pneumonia. No acute osseous abnormality. No effusion or pneumothorax. IMPRESSION: Mild interstitial edema. Left lower lobe retrocardiac opacities suspicious for left lower lobe pneumonia. Electronically Signed   By: Tollie Eth M.D.   On: 08/10/2016 22:22    EKG: Independently reviewed.   Assessment/Plan Principal Problem:   CAP (community acquired pneumonia) Active Problems:   Schizophrenia (HCC)   Hypertension   Acute kidney injury (HCC)   Acute respiratory failure with hypoxia (HCC)   CKD (chronic kidney disease), stage III    PLAN:   CAP:  Will continue with IV Rocephin and IV Zirthromax.  No clinical evidence of sepsis.  Will give tylenol.  Flu test was negative.   AKI on CKD:  Avoid nephrotoxic drugs.  Give IVF and follow Cr carefully.  Schizophrenia:  Continue with antipsychotics.  HTN:  BP is a little high.  No meds.  Will follow.   DVT prophylaxis: subQ heparin.  Code Status: FULL CODE.  Family Communication: Brother at bedside.  Disposition Plan: Home when better.  Consults called: None. Admission status: Inpatient.    Shelbylynn Walczyk MD FACP. [facility]  If 7PM-7AM, please contact night-coverage [email or web address] Password [facility]  08/10/2016, 11:44 PM

## 2016-08-10 NOTE — ED Triage Notes (Signed)
Per caregiver pt started with chills x 3-4 hours ago. Pt will not answer questions appropriately. Denies any n/v/d.

## 2016-08-10 NOTE — ED Notes (Signed)
Got pt up was able to walk from bed to door w/ a lot of assistance. O2 sats remained 90 to 92 %. EDP made aware.

## 2016-08-10 NOTE — ED Provider Notes (Signed)
[facility]-EMERGENCY DEPT Provider Note   CSN: [id number] Arrival date & time: 08/10/16  2035 By signing my name below, I, Levon HedgerElizabeth Hall, attest that this documentation has been prepared under the direction and in the presence of Marily MemosJason Lilyonna Steidle, MD . Electronically Signed: Levon HedgerElizabeth Hall, Scribe. 08/10/2016. 8:57 PM.   History   Chief Complaint Chief Complaint  Patient presents with  . Altered Mental Status   LEVEL 5 CAVEAT DUE TO AMS  HPI Roger Frye is a 76 y.o. male, with a hx of HTN, stroke, schizophrenia, accompanied by brother brother who presents to the Emergency Department with a complain of altered mental status onset 3-4 hours ago. Per brother, pt's began having associated fever and chills at that time. Brother also reports associated weakness and states pt was unable to ambulate on his own. No alleviating or modifying factors noted. No attempted treatments indicated. No recent sick contact. No hx of UTI. Pt is a former smoker. Pt's brother denies any new cough, dysuria, hematuria, abdominal pain, rash, or headache.   The history is provided by a relative. No language interpreter was used.   Past Medical History:  Diagnosis Date  . CAP (community acquired pneumonia) 11/12/2014  . GERD (gastroesophageal reflux disease)   . High cholesterol   . Hypertension   . Schizophrenia (HCC)   . Stroke [facility](HCC) 2008  . Tick borne fever 11/12/2014   RMSF titer positive/elevated.     Patient Active Problem List   Diagnosis Date Noted  . CKD (chronic kidney disease), stage III 04/02/2015  . Thrombocytopenia (HCC) 04/02/2015  . Anemia 04/02/2015  . Facial cellulitis 04/01/2015  . Acute respiratory failure with hypoxia (HCC) 11/13/2014  . Acute bronchitis 11/13/2014  . Gram-positive bacteremia 11/13/2014  . CAP (community acquired pneumonia) 11/12/2014  . Tick borne fever 11/12/2014  . Acute kidney injury (HCC) 11/12/2014  . Tobacco use disorder 11/12/2014  . Sepsis (HCC) 11/12/2014  .  Schizophrenia (HCC) 11/11/2014  . Hypertension 11/11/2014    Past Surgical History:  Procedure Laterality Date  . APPENDECTOMY    . CATARACT EXTRACTION W/PHACO Right 10/07/2013   Procedure: CATARACT EXTRACTION PHACO AND INTRAOCULAR LENS PLACEMENT (IOC);  Surgeon: Gemma PayorKerry Hunt, MD;  Location: [facility] ORS;  Service: Ophthalmology;  Laterality: Right;  CDE 19.32  . 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 Authorizing Provider  aspirin EC 81 MG tablet Take 81 mg by mouth daily.    Historical Provider, MD  divalproex (DEPAKOTE) 500 MG 24 hr tablet Take 1,000 mg by mouth at bedtime.     Historical Provider, MD  enalapril (VASOTEC) 10 MG tablet Take 10 mg by mouth daily.     Historical Provider, MD  levofloxacin (LEVAQUIN) 750 MG tablet Take 1 tablet (750 mg total) by mouth daily. Take each evening 04/03/15   Standley Brookinganiel P Goodrich, MD  LORazepam (ATIVAN) 2 MG tablet Take 2 mg by mouth 4 (four) times daily as needed. FOR ANXIETY     Historical Provider, MD  meclizine (ANTIVERT) 25 MG tablet Take 25 mg by mouth 2 (two) times daily.    Historical Provider, MD  nystatin cream (MYCOSTATIN) Apply topically 2 (two) times daily. to affected area 03/25/15   Historical Provider, MD  OLANZapine (ZYPREXA) 20 MG tablet Take 20 mg by mouth at bedtime.     Historical  Provider, MD  PROAIR RESPICLICK 108 (90 BASE) MCG/ACT AEPB Inhale 1 puff into the lungs 2 (two) times daily. For s 11/17/14   Historical Provider, MD  simvastatin (ZOCOR) 40 MG tablet Take 40 mg by mouth at bedtime.      Historical Provider, MD  VESICARE 5 MG tablet Take 5 mg by mouth daily. 10/27/14   Historical Provider, MD    Family History History reviewed. No pertinent family history.  Social History Social History  Substance Use Topics  . Smoking status: Former Smoker    Packs/day: 0.25    Years: 50.00     Types: Cigarettes    Quit date: 11/29/2014  . Smokeless tobacco: Never Used  . Alcohol use No     Allergies   Patient has no known allergies.   Review of Systems Review of Systems  Unable to perform ROS: Mental status change     Physical Exam Updated Vital Signs BP 135/64   Pulse 91   Temp 98.6 F (37 C) (Oral)   Resp 24   SpO2 91%   Physical Exam  Constitutional: He is oriented to person, place, and time. He appears well-developed and well-nourished. No distress.  HENT:  Head: Normocephalic and atraumatic.  Eyes: Conjunctivae are normal.  Neck: Normal range of motion.  Cardiovascular: An irregular rhythm present. Tachycardia present.   Pulmonary/Chest: Effort normal. Tachypnea noted. He has rhonchi in the left upper field and the left lower field.  Right leg is clear, Rhonchi in left upper and lower lobe.   Abdominal: He exhibits no distension. There is no tenderness.  Benign  Neurological: He is alert and oriented to person, place, and time.  Skin: Skin is warm and dry.  Psychiatric: He has a normal mood and affect.  Nursing note and vitals reviewed.   ED Treatments / Results  DIAGNOSTIC STUDIES:  Oxygen Saturation is 89% on RA, low by my interpretation.    COORDINATION OF CARE:  8:54 PM Discussed treatment plan with pt and brother at bedside and brother agreed to plan.  Labs (all labs ordered are listed, but only abnormal results are displayed) Labs Reviewed  COMPREHENSIVE METABOLIC PANEL - Abnormal; Notable for the following:       Result Value   Sodium 133 (*)    Chloride 98 (*)    Glucose, Bld 177 (*)    BUN 22 (*)    Creatinine, Ser 1.61 (*)    Calcium 8.2 (*)    Albumin 3.3 (*)    ALT 9 (*)    GFR calc non Af Amer 41 (*)    GFR calc Af Amer 47 (*)    All other components within normal limits  CBC WITH DIFFERENTIAL/PLATELET - Abnormal; Notable for the following:    WBC 14.4 (*)    Neutro Abs 11.9 (*)    All other components within normal  limits  URINALYSIS, ROUTINE W REFLEX MICROSCOPIC - Abnormal; Notable for the following:    Glucose, UA 50 (*)    All other components within normal limits  I-STAT CG4 LACTIC ACID, ED - Abnormal; Notable for the following:    Lactic Acid, Venous 2.85 (*)    All other components within normal limits  CULTURE, BLOOD (ROUTINE X 2)  CULTURE, BLOOD (ROUTINE X 2)  URINE CULTURE  INFLUENZA PANEL BY PCR (TYPE A & B)    EKG  EKG Interpretation None       Radiology Dg Chest 2 View  Result Date: 08/10/2016  CLINICAL DATA:  Patient presents with recent chills. Former smoker and hypertension. EXAM: CHEST  2 VIEW COMPARISON:  02/09/2016 CXR, 02/24/2016 chest CT FINDINGS: Heart is top-normal in size. The aorta is atherosclerotic at its arch. Mild diffuse interstitial prominence may reflect mild interstitial edema. Slightly more confluent left lower lobe airspace opacities are suspicious for left lower lobe pneumonia. No acute osseous abnormality. No effusion or pneumothorax. IMPRESSION: Mild interstitial edema. Left lower lobe retrocardiac opacities suspicious for left lower lobe pneumonia. Electronically Signed   By: Tollie Eth M.D.   On: 08/10/2016 22:22    Procedures Procedures (including critical care time)  Medications Ordered in ED Medications  azithromycin (ZITHROMAX) 500 mg in dextrose 5 % 250 mL IVPB (500 mg Intravenous New Bag/Given 08/10/16 2329)  sodium chloride 0.9 % bolus 1,000 mL (0 mLs Intravenous Stopped 08/10/16 2222)  sodium chloride 0.9 % bolus 1,000 mL (0 mLs Intravenous Stopped 08/10/16 2334)  acetaminophen (TYLENOL) tablet 650 mg (650 mg Oral Given 08/10/16 2137)  sodium chloride 0.9 % bolus 1,000 mL (0 mLs Intravenous Stopped 08/10/16 2239)  cefTRIAXone (ROCEPHIN) 1 g in dextrose 5 % 50 mL IVPB (0 g Intravenous Stopped 08/10/16 2325)     Initial Impression / Assessment and Plan / ED Course  I have reviewed the triage vital signs and the nursing notes.  Pertinent labs &  imaging results that were available during my care of the patient were reviewed by me and considered in my medical decision making (see chart for details).   Has pneumonia. significant weakness as well. Borderline low sats while in bed and ambulating. Elevated lactic, wbc, fever, HR so code sepsis. 3L and antipyretics with vast improvement. Flu swab was negative. Patient was started on Rocephin and azithromycin. Plan for admission to the hospital secondary to his age and how ill he appeared when he initially got here.  Final Clinical Impressions(s) / ED Diagnoses   Final diagnoses:  Community acquired pneumonia of left lower lobe of lung (HCC)  Sepsis, due to unspecified organism [facility])     I personally performed the services described in this documentation, which was scribed in my presence. The recorded information has been reviewed and is accurate.     Marily Memos, MD 08/10/16 [phone]

## 2016-08-10 NOTE — ED Notes (Signed)
Pt returned from xray & was advised IV had been pulled out. Pt had arm wrapped in a towel. Removed IV dressing, cleaned blood off pt & band aid applied.

## 2016-08-10 NOTE — ED Notes (Signed)
CRITICAL VALUE ALERT  Critical value received:  I stat lactic acid 2.85  Date of notification:  08/10/16  Time of notification:  2126  Critical value read back:Yes.    Nurse who received alert:  Thornton Daleson L Joaquim Tolen, RN  MD notified (1st page):  Messner  Time of first page:  2126  MD notified (2nd page):  Time of second page:  Responding MD:  Erin HearingMessner  Time MD responded:  2126

## 2016-08-11 DIAGNOSIS — J189 Pneumonia, unspecified organism: Principal | ICD-10-CM

## 2016-08-11 LAB — COMPREHENSIVE METABOLIC PANEL
ALT: 7 U/L — ABNORMAL LOW (ref 17–63)
ANION GAP: 6 (ref 5–15)
AST: 10 U/L — AB (ref 15–41)
Albumin: 2.6 g/dL — ABNORMAL LOW (ref 3.5–5.0)
Alkaline Phosphatase: 62 U/L (ref 38–126)
BILIRUBIN TOTAL: 0.5 mg/dL (ref 0.3–1.2)
BUN: 18 mg/dL (ref 6–20)
CHLORIDE: 104 mmol/L (ref 101–111)
CO2: 27 mmol/L (ref 22–32)
Calcium: 7.7 mg/dL — ABNORMAL LOW (ref 8.9–10.3)
Creatinine, Ser: 1.45 mg/dL — ABNORMAL HIGH (ref 0.61–1.24)
GFR calc Af Amer: 54 mL/min — ABNORMAL LOW (ref >60)
GFR, EST NON AFRICAN AMERICAN: 46 mL/min — AB (ref >60)
Glucose, Bld: 94 mg/dL (ref 65–99)
POTASSIUM: 4.7 mmol/L (ref 3.5–5.1)
Sodium: 137 mmol/L (ref 135–145)
Total Protein: 6.2 g/dL — ABNORMAL LOW (ref 6.5–8.1)

## 2016-08-11 LAB — CBC
HEMATOCRIT: 35.2 % — AB (ref 39.0–52.0)
HEMOGLOBIN: 11.6 g/dL — AB (ref 13.0–17.0)
MCH: 31.3 pg (ref 26.0–34.0)
MCHC: 33 g/dL (ref 30.0–36.0)
MCV: 94.9 fL (ref 78.0–100.0)
Platelets: 194 10*3/uL (ref 150–400)
RBC: 3.71 MIL/uL — AB (ref 4.22–5.81)
RDW: 13.7 % (ref 11.5–15.5)
WBC: 16.3 10*3/uL — AB (ref 4.0–10.5)

## 2016-08-11 LAB — VITAMIN B12: VITAMIN B 12: 564 pg/mL (ref 180–914)

## 2016-08-11 LAB — TSH: TSH: 6.164 u[IU]/mL — ABNORMAL HIGH (ref 0.350–4.500)

## 2016-08-11 MED ORDER — ACETAMINOPHEN 325 MG PO TABS
650.0000 mg | ORAL_TABLET | Freq: Four times a day (QID) | ORAL | Status: DC | PRN
  Filled 2016-08-11: qty 2

## 2016-08-11 MED ORDER — DEXTROSE 5 % IV SOLN
1.0000 g | INTRAVENOUS | Status: DC
  Administered 2016-08-11 – 2016-08-12 (×2): 1 g via INTRAVENOUS
  Filled 2016-08-11 (×4): qty 10

## 2016-08-11 MED ORDER — SODIUM CHLORIDE 0.9 % IV SOLN
INTRAVENOUS | Status: DC
  Administered 2016-08-11 – 2016-08-13 (×4): via INTRAVENOUS

## 2016-08-11 MED ORDER — SODIUM CHLORIDE 0.9% FLUSH
3.0000 mL | Freq: Two times a day (BID) | INTRAVENOUS | Status: DC
  Administered 2016-08-11 – 2016-08-12 (×4): 3 mL via INTRAVENOUS

## 2016-08-11 MED ORDER — ACETAMINOPHEN 650 MG RE SUPP: 650.0000 mg | Freq: Four times a day (QID) | RECTAL | Status: DC | PRN

## 2016-08-11 MED ORDER — SIMVASTATIN 10 MG PO TABS
40.0000 mg | ORAL_TABLET | Freq: Every day | ORAL | Status: DC
  Administered 2016-08-11 – 2016-08-12 (×3): 40 mg via ORAL
  Filled 2016-08-11 (×3): qty 4

## 2016-08-11 MED ORDER — ENALAPRIL MALEATE 5 MG PO TABS
10.0000 mg | ORAL_TABLET | Freq: Every day | ORAL | Status: DC
Start: 2016-08-11 — End: 2016-08-13
  Administered 2016-08-11 – 2016-08-13 (×3): 10 mg via ORAL
  Filled 2016-08-11 (×2): qty 2
  Filled 2016-08-11: qty 1
  Filled 2016-08-11 (×4): qty 2
  Filled 2016-08-11: qty 1

## 2016-08-11 MED ORDER — OLANZAPINE 5 MG PO TABS
20.0000 mg | ORAL_TABLET | Freq: Every day | ORAL | Status: DC
  Administered 2016-08-11 – 2016-08-12 (×3): 20 mg via ORAL
  Filled 2016-08-11 (×3): qty 4

## 2016-08-11 MED ORDER — LEVOTHYROXINE SODIUM 25 MCG PO TABS: 25.0000 ug | ORAL_TABLET | Freq: Every day | ORAL | Status: DC

## 2016-08-11 MED ORDER — ALBUTEROL SULFATE (2.5 MG/3ML) 0.083% IN NEBU: 3.0000 mL | INHALATION_SOLUTION | Freq: Two times a day (BID) | RESPIRATORY_TRACT | Status: DC

## 2016-08-11 MED ORDER — DARIFENACIN HYDROBROMIDE ER 7.5 MG PO TB24
7.5000 mg | ORAL_TABLET | Freq: Every day | ORAL | Status: DC
  Administered 2016-08-11 – 2016-08-13 (×3): 7.5 mg via ORAL
  Filled 2016-08-11 (×3): qty 1

## 2016-08-11 MED ORDER — ORAL CARE MOUTH RINSE
15.0000 mL | Freq: Two times a day (BID) | OROMUCOSAL | Status: DC
  Administered 2016-08-11 – 2016-08-12 (×4): 15 mL via OROMUCOSAL

## 2016-08-11 MED ORDER — DEXTROSE 5 % IV SOLN
500.0000 mg | INTRAVENOUS | Status: DC
  Administered 2016-08-11 – 2016-08-13 (×2): 500 mg via INTRAVENOUS
  Filled 2016-08-11 (×4): qty 500

## 2016-08-11 MED ORDER — DIVALPROEX SODIUM ER 500 MG PO TB24
1000.0000 mg | ORAL_TABLET | Freq: Every day | ORAL | Status: DC
  Administered 2016-08-11 – 2016-08-12 (×3): 1000 mg via ORAL
  Filled 2016-08-11 (×3): qty 2

## 2016-08-11 MED ORDER — ALBUTEROL SULFATE (2.5 MG/3ML) 0.083% IN NEBU
3.0000 mL | INHALATION_SOLUTION | Freq: Two times a day (BID) | RESPIRATORY_TRACT | Status: DC
  Administered 2016-08-11 – 2016-08-13 (×5): 3 mL via RESPIRATORY_TRACT
  Filled 2016-08-11 (×5): qty 3

## 2016-08-11 MED ORDER — ASPIRIN EC 81 MG PO TBEC
81.0000 mg | DELAYED_RELEASE_TABLET | Freq: Every day | ORAL | Status: DC
  Administered 2016-08-11 – 2016-08-13 (×3): 81 mg via ORAL
  Filled 2016-08-11 (×3): qty 1

## 2016-08-11 MED ORDER — ENOXAPARIN SODIUM 40 MG/0.4ML ~~LOC~~ SOLN
40.0000 mg | SUBCUTANEOUS | Status: DC
  Administered 2016-08-11 – 2016-08-12 (×2): 40 mg via SUBCUTANEOUS
  Filled 2016-08-11 (×3): qty 0.4

## 2016-08-11 MED ORDER — LORAZEPAM 1 MG PO TABS: 1.0000 mg | ORAL_TABLET | ORAL | Status: DC | PRN

## 2016-08-11 NOTE — ED Notes (Signed)
Pt placed on 2l O2 sats dropping into the upper 80's while sleeping.

## 2016-08-11 NOTE — Progress Notes (Signed)
PROGRESS NOTE    Roger Frye  [id number] DOB: 10/04/1940 DOA: 08/10/2016 PCP: Milana ObeyStephen D Knowlton, MD     Brief Narrative:  76 year old man admitted from home on 1/24 due to weakness and fever thought to have community-acquired pneumonia and admitted for further evaluation and management.   Assessment & Plan:   Principal Problem:   CAP (community acquired pneumonia) Active Problems:   Schizophrenia (HCC)   Hypertension   Acute kidney injury (HCC)   Acute respiratory failure with hypoxia (HCC)   CKD (chronic kidney disease), stage III   Generalized weakness -Due to community-acquired pneumonia. -Continue Rocephin and azithromycin, follow culture data. -Check TSH/vitamin B-12. -We'll request physical therapy consultation.  Hypothyroidism -Check TSH. -Continue home dose of Synthroid.  Acute renal failure -Improving with IV fluids. Continue to monitor renal function.  Schizophrenia -Mood stable. -Continue antipsychotics   DVT prophylaxis: Lovenox Code Status: full code Family Communication: brother at bedside Disposition Plan: hope for DC home in 24-48 hours  Consultants:   None  Procedures:   None  Antimicrobials:  Anti-infectives    Start     Dose/Rate Route Frequency Ordered Stop   08/11/16 2300  azithromycin (ZITHROMAX) 500 mg in dextrose 5 % 250 mL IVPB     500 mg 250 mL/hr over 60 Minutes Intravenous Every 24 hours 08/11/16 0101     08/11/16 2200  cefTRIAXone (ROCEPHIN) 1 g in dextrose 5 % 50 mL IVPB     1 g 100 mL/hr over 30 Minutes Intravenous Every 24 hours 08/11/16 0101     08/10/16 2245  cefTRIAXone (ROCEPHIN) 1 g in dextrose 5 % 50 mL IVPB     1 g 100 mL/hr over 30 Minutes Intravenous  Once 08/10/16 2240 08/10/16 2325   08/10/16 2245  azithromycin (ZITHROMAX) 500 mg in dextrose 5 % 250 mL IVPB     500 mg 250 mL/hr over 60 Minutes Intravenous  Once 08/10/16 2240 08/11/16 0026       Subjective: Feels better, IV bother  him  Objective: Vitals:   08/11/16 0548 08/11/16 0700 08/11/16 0945 08/11/16 1502  BP:  123/63  128/76  Pulse: 70 74  65  Resp: 20   18  Temp: 98.5 F (36.9 C)   98.4 F (36.9 C)  TempSrc: Oral   Axillary  SpO2: 94%  91% 97%  Weight:      Height:        Intake/Output Summary (Last 24 hours) at 08/11/16 1628 Last data filed at 08/11/16 1500  Gross per 24 hour  Intake          4824.66 ml  Output                0 ml  Net          4824.66 ml   Filed Weights   08/11/16 0123  Weight: 97 kg (213 lb 14.4 oz)    Examination:  General exam: Alert, awake, oriented x 3 Respiratory system: Clear to auscultation. Respiratory effort normal. Cardiovascular system:RRR. No murmurs, rubs, gallops. Gastrointestinal system: Abdomen is nondistended, soft and nontender. No organomegaly or masses felt. Normal bowel sounds heard. Central nervous system: Alert and oriented. No focal neurological deficits. Extremities: No C/C/E, +pedal pulses Skin: No rashes, lesions or ulcers Psychiatry: Judgement and insight appear normal. Mood & affect appropriate.     Data Reviewed: I have personally reviewed following labs and imaging studies  CBC:  Recent Labs Lab 08/10/16 2058 08/11/16 0620  WBC 14.4*  16.3*  NEUTROABS 11.9*  --   HGB 13.0 11.6*  HCT 39.9 35.2*  MCV 93.9 94.9  PLT 217 194   Basic Metabolic Panel:  Recent Labs Lab 08/10/16 2058 08/11/16 0620  NA 133* 137  K 4.9 4.7  CL 98* 104  CO2 27 27  GLUCOSE 177* 94  BUN 22* 18  CREATININE 1.61* 1.45*  CALCIUM 8.2* 7.7*   GFR: Estimated Creatinine Clearance: 52.1 mL/min (by C-G formula based on SCr of 1.45 mg/dL (H)). Liver Function Tests:  Recent Labs Lab 08/10/16 2058 08/11/16 0620  AST 17 10*  ALT 9* 7*  ALKPHOS 77 62  BILITOT 0.4 0.5  PROT 7.7 6.2*  ALBUMIN 3.3* 2.6*   No results for input(s): LIPASE, AMYLASE in the last 168 hours. No results for input(s): AMMONIA in the last 168 hours. Coagulation  Profile: No results for input(s): INR, PROTIME in the last 168 hours. Cardiac Enzymes: No results for input(s): CKTOTAL, CKMB, CKMBINDEX, TROPONINI in the last 168 hours. BNP (last 3 results) No results for input(s): PROBNP in the last 8760 hours. HbA1C: No results for input(s): HGBA1C in the last 72 hours. CBG: No results for input(s): GLUCAP in the last 168 hours. Lipid Profile: No results for input(s): CHOL, HDL, LDLCALC, TRIG, CHOLHDL, LDLDIRECT in the last 72 hours. Thyroid Function Tests: No results for input(s): TSH, T4TOTAL, FREET4, T3FREE, THYROIDAB in the last 72 hours. Anemia Panel: No results for input(s): VITAMINB12, FOLATE, FERRITIN, TIBC, IRON, RETICCTPCT in the last 72 hours. Urine analysis:    Component Value Date/Time   COLORURINE YELLOW 08/10/2016 2058   APPEARANCEUR CLEAR 08/10/2016 2058   LABSPEC 1.018 08/10/2016 2058   PHURINE 7.0 08/10/2016 2058   GLUCOSEU 50 (A) 08/10/2016 2058   HGBUR NEGATIVE 08/10/2016 2058   BILIRUBINUR NEGATIVE 08/10/2016 2058   KETONESUR NEGATIVE 08/10/2016 2058   PROTEINUR NEGATIVE 08/10/2016 2058   UROBILINOGEN 0.2 12/12/2014 1830   NITRITE NEGATIVE 08/10/2016 2058   LEUKOCYTESUR NEGATIVE 08/10/2016 2058   Sepsis Labs: @LABRCNTIP (procalcitonin:4,lacticidven:4)  ) Recent Results (from the past 240 hour(s))  Blood Culture (routine x 2)     Status: None (Preliminary result)   Collection Time: 08/10/16  9:10 PM  Result Value Ref Range Status   Specimen Description LEFT ANTECUBITAL  Final   Special Requests BOTTLES DRAWN AEROBIC AND ANAEROBIC 6CC  Final   Culture PENDING  Incomplete   Report Status PENDING  Incomplete  Blood Culture (routine x 2)     Status: None (Preliminary result)   Collection Time: 08/10/16  9:27 PM  Result Value Ref Range Status   Specimen Description LEFT ANTECUBITAL  Final   Special Requests BOTTLES DRAWN AEROBIC AND ANAEROBIC [facility]  Final   Culture PENDING  Incomplete   Report Status PENDING   Incomplete         Radiology Studies: Dg Chest 2 View  Result Date: 08/10/2016 CLINICAL DATA:  Patient presents with recent chills. Former smoker and hypertension. EXAM: CHEST  2 VIEW COMPARISON:  02/09/2016 CXR, 02/24/2016 chest CT FINDINGS: Heart is top-normal in size. The aorta is atherosclerotic at its arch. Mild diffuse interstitial prominence may reflect mild interstitial edema. Slightly more confluent left lower lobe airspace opacities are suspicious for left lower lobe pneumonia. No acute osseous abnormality. No effusion or pneumothorax. IMPRESSION: Mild interstitial edema. Left lower lobe retrocardiac opacities suspicious for left lower lobe pneumonia. Electronically Signed   By: Tollie Eth M.D.   On: 08/10/2016 22:22  Scheduled Meds: . albuterol  3 mL Inhalation BID  . aspirin EC  81 mg Oral Daily  . azithromycin  500 mg Intravenous Q24H  . cefTRIAXone (ROCEPHIN)  IV  1 g Intravenous Q24H  . darifenacin  7.5 mg Oral Daily  . divalproex  1,000 mg Oral QHS  . enalapril  10 mg Oral Daily  . enoxaparin (LOVENOX) injection  40 mg Subcutaneous Q24H  . mouth rinse  15 mL Mouth Rinse BID  . OLANZapine  20 mg Oral QHS  . simvastatin  40 mg Oral QHS  . sodium chloride flush  3 mL Intravenous Q12H   Continuous Infusions: . sodium chloride 100 mL/hr at 08/11/16 0211     LOS: 1 day    Time spent: 25 minutes. Greater than 50% of this time was spent in direct contact with the patient coordinating care.     Chaya Jan, MD [facility] Pager [phone]  If 7PM-7AM, please contact night-coverage [email or web address] Password [facility] 08/11/2016, 4:28 PM

## 2016-08-11 NOTE — ED Notes (Signed)
Attempted to call report. Was advised nurse receiving pt will call this nurse back for report. 

## 2016-08-12 LAB — BASIC METABOLIC PANEL
Anion gap: 5 (ref 5–15)
BUN: 13 mg/dL (ref 6–20)
CALCIUM: 7.9 mg/dL — AB (ref 8.9–10.3)
CHLORIDE: 106 mmol/L (ref 101–111)
CO2: 25 mmol/L (ref 22–32)
CREATININE: 1.42 mg/dL — AB (ref 0.61–1.24)
GFR calc Af Amer: 55 mL/min — ABNORMAL LOW (ref >60)
GFR calc non Af Amer: 47 mL/min — ABNORMAL LOW (ref >60)
GLUCOSE: 93 mg/dL (ref 65–99)
Potassium: 4.7 mmol/L (ref 3.5–5.1)
Sodium: 136 mmol/L (ref 135–145)

## 2016-08-12 LAB — CBC
HCT: 33.3 % — ABNORMAL LOW (ref 39.0–52.0)
Hemoglobin: 10.9 g/dL — ABNORMAL LOW (ref 13.0–17.0)
MCH: 31.1 pg (ref 26.0–34.0)
MCHC: 32.7 g/dL (ref 30.0–36.0)
MCV: 94.9 fL (ref 78.0–100.0)
PLATELETS: 188 10*3/uL (ref 150–400)
RBC: 3.51 MIL/uL — ABNORMAL LOW (ref 4.22–5.81)
RDW: 13.8 % (ref 11.5–15.5)
WBC: 11.2 10*3/uL — ABNORMAL HIGH (ref 4.0–10.5)

## 2016-08-12 LAB — URINE CULTURE: Culture: NO GROWTH

## 2016-08-12 MED ORDER — LEVOTHYROXINE SODIUM 50 MCG PO TABS
50.0000 ug | ORAL_TABLET | Freq: Every day | ORAL | Status: DC
  Administered 2016-08-12 – 2016-08-13 (×2): 50 ug via ORAL
  Filled 2016-08-12 (×2): qty 1

## 2016-08-12 NOTE — Evaluation (Signed)
Physical Therapy Evaluation Patient Details Name: Roger Frye MRN: [id number] DOB: 02/24/1941 Today's Date: 08/12/2016   History of Present Illness  76 y.o. male with hx of prior CAP, schizophrenia, HTN, HLD, hx of prior stroke, lives at home with his older brother, brought to the ER as he was feeling weak, and unable to walk.  He was found to have a low grade fever of 100.4.  He has a non productive coughs, but denied diffuse myalgia.  Work up in the ER showed leukocytosis with WBC of 14K, CXR showed LLL infiltrate.  His Cr was 1.6, and BS was 177.  BC were obtained, and he was given IV Rocephin and Zithromax for CAP.    Clinical Impression  Pt received in bed, and was agreeable to PT evaluation.  Pt expressed that he lives with his 74 yo brother, and he is normally able to ambulate unlimited community distances without assistive device.  He is independent with dressing but occasionally he requires assistance for bathing.  During today's evaluation, he required Min A for bed mobility, and Min A for sit<>stand with RW.  He was able to ambulate 6625ft with RW, but required Min A due to festinating gait with quick changes in speed, as well as unsteadiness.  Pt is slightly confused, and believes that it is October, and it is 6pm.  Uncertain of how much assistance pt's brother would be able to provide, therefore recommendation is for HHPT with 24/7 supervision/assistance vs SNF.    Follow Up Recommendations Home health PT;Supervision/Assistance - 24 hour;SNF    Equipment Recommendations       Recommendations for Other Services       Precautions / Restrictions Precautions Precautions: Fall Precaution Comments: Pt initially states that he has not had any falls, however PT noticed that he has scabs on both knees, and he then stated that yes he does fall.   Restrictions Weight Bearing Restrictions: No      Mobility  Bed Mobility Overal bed mobility: Needs Assistance Bed Mobility: Supine to Sit      Supine to sit: Min assist;HOB elevated        Transfers Overall transfer level: Needs assistance Equipment used: Rolling walker (2 wheeled) Transfers: Sit to/from Stand Sit to Stand: Min assist         General transfer comment: increased time with cues for hand placement and assist to correct posterior lean upon standing.    Ambulation/Gait Ambulation/Gait assistance: Min assist Ambulation Distance (Feet): 25 Feet Assistive device: Rolling walker (2 wheeled) Gait Pattern/deviations: Festinating;Trunk flexed     General Gait Details: Pt is unsteady with gait, and demonstrates festinating gait and quickly changes speeds.  Increased unsteadiness with changing direction and cues to roll the walker vs picking it up.    Stairs            Wheelchair Mobility    Modified Rankin (Stroke Patients Only)       Balance Overall balance assessment: History of Falls;Needs assistance Sitting-balance support: Bilateral upper extremity supported;Feet supported Sitting balance-Leahy Scale: Good     Standing balance support: Bilateral upper extremity supported Standing balance-Leahy Scale: Poor Standing balance comment: B UE on RW.                              Pertinent Vitals/Pain Pain Assessment: No/denies pain    Home Living   Living Arrangements: Other (Comment) (brother - connie) Available Help at Discharge:  Available 24 hours/day Type of Home: House Home Access: Stairs to enter   [employer] of Steps: 2 Home Layout: One level Home Equipment: Wheelchair - Economist - 2 wheels      Prior Function     Gait / Transfers Assistance Needed: independent with unlimited community ambulation  ADL's / Homemaking Assistance Needed: independent with dressing, and occasional assistance for bathing.          Hand Dominance        Extremity/Trunk Assessment   Upper Extremity Assessment Upper Extremity Assessment: Overall WFL  for tasks assessed    Lower Extremity Assessment Lower Extremity Assessment: Generalized weakness       Communication   Communication: No difficulties  Cognition Arousal/Alertness: Awake/alert Behavior During Therapy: WFL for tasks assessed/performed Overall Cognitive Status: Impaired/Different from baseline Area of Impairment: Orientation Orientation Level: Time (Stated the month was October, and asked if it was 6pm)                  General Comments      Exercises     Assessment/Plan    PT Assessment Patient needs continued PT services  PT Problem List Decreased strength;Decreased activity tolerance;Decreased balance;Decreased mobility;Decreased coordination;Decreased knowledge of use of DME;Decreased safety awareness;Decreased knowledge of precautions          PT Treatment Interventions DME instruction;Gait training;Functional mobility training;Therapeutic activities;Therapeutic exercise;Balance training;Cognitive remediation;Patient/family education    PT Goals (Current goals can be found in the Care Plan section)  Acute Rehab PT Goals Patient Stated Goal: Pt wants to go home.  PT Goal Formulation: With patient Time For Goal Achievement: 08/19/16 Potential to Achieve Goals: Fair    Frequency Min 3X/week   Barriers to discharge        Co-evaluation               End of Session Equipment Utilized During Treatment: Gait belt Activity Tolerance: Patient limited by fatigue Patient left: in chair;with call bell/phone within reach;with nursing/sitter in room Nurse Communication: Mobility status Misty Stanley, RN notified of pt's mobility status.  Mobility sheet left hanging in patients room.)    Functional Assessment Tool Used: [employer] AM-PAC "6-clicks"  Functional Limitation: Mobility: Walking and moving around Mobility: Walking and Moving Around Current Status [phone]): At least 40 percent but less than 60 percent impaired, limited or  restricted Mobility: Walking and Moving Around Goal Status [phone]): At least 20 percent but less than 40 percent impaired, limited or restricted    Time: 1007-1034 PT Time Calculation (min) (ACUTE ONLY): 27 min   Charges:   PT Evaluation $PT Eval Low Complexity: 1 Procedure PT Treatments $Gait Training: 8-22 mins   PT G Codes:   PT G-Codes **NOT FOR INPATIENT CLASS** Functional Assessment Tool Used: [employer] "6-clicks"  Functional Limitation: Mobility: Walking and moving around Mobility: Walking and Moving Around Current Status [phone]): At least 40 percent but less than 60 percent impaired, limited or restricted Mobility: Walking and Moving Around Goal Status [phone]): At least 20 percent but less than 40 percent impaired, limited or restricted    Beth Shiree Altemus, PT, DPT X: [phone]

## 2016-08-12 NOTE — Progress Notes (Signed)
PROGRESS NOTE    Roger Frye  ZOX:[id number] DOB: [birth year] DOA: 08/10/2016 PCP: Milana Obey, MD     Brief Narrative:  76 year old man admitted from home on 1/24 due to weakness and fever thought to have community-acquired pneumonia and admitted for further evaluation and management.   Assessment & Plan:   Principal Problem:   CAP (community acquired pneumonia) Active Problems:   Schizophrenia (HCC)   Hypertension   Acute kidney injury (HCC)   Acute respiratory failure with hypoxia (HCC)   CKD (chronic kidney disease), stage III   Generalized weakness -Due to community-acquired pneumonia. -Continue Rocephin and azithromycin, culture data remains negative. -B12 WNL, TSH high (see below) -Will request physical therapy consultation.  Hypothyroidism -TSH is elevated at 6.164. -He states he has been compliant with his home Synthroid dose, will increase from 25-50 g daily.  Acute renal failure on chronic kidney disease stage III -Baseline creatinine is around 1.5. -Creatinine is currently at baseline at 1.45.  Schizophrenia -Mood stable. -Continue antipsychotics   DVT prophylaxis: Lovenox Code Status: full code Family Communication: brother at bedside Disposition Plan: hope for DC home in 24-48 hours  Consultants:   None  Procedures:   None  Antimicrobials:  Anti-infectives    Start     Dose/Rate Route Frequency Ordered Stop   08/11/16 2300  azithromycin (ZITHROMAX) 500 mg in dextrose 5 % 250 mL IVPB     500 mg 250 mL/hr over 60 Minutes Intravenous Every 24 hours 08/11/16 0101     08/11/16 2200  cefTRIAXone (ROCEPHIN) 1 g in dextrose 5 % 50 mL IVPB     1 g 100 mL/hr over 30 Minutes Intravenous Every 24 hours 08/11/16 0101     08/10/16 2245  cefTRIAXone (ROCEPHIN) 1 g in dextrose 5 % 50 mL IVPB     1 g 100 mL/hr over 30 Minutes Intravenous  Once 08/10/16 2240 08/10/16 2325   08/10/16 2245  azithromycin (ZITHROMAX) 500 mg in dextrose 5 % 250 mL  IVPB     500 mg 250 mL/hr over 60 Minutes Intravenous  Once 08/10/16 2240 08/11/16 0026       Subjective: Feels better, IV bother him  Objective: Vitals:   08/11/16 2107 08/11/16 2118 08/12/16 0607 08/12/16 0837  BP:  116/62 (!) 157/94   Pulse:  73 98   Resp:  20 20   Temp:  98.8 F (37.1 C) 97.8 F (36.6 C)   TempSrc:  Oral Oral   SpO2: 94% 95% 94% (!) 87%  Weight:      Height:        Intake/Output Summary (Last 24 hours) at 08/12/16 1113 Last data filed at 08/12/16 0635  Gross per 24 hour  Intake          2938.33 ml  Output                0 ml  Net          2938.33 ml   Filed Weights   08/11/16 0123  Weight: 97 kg (213 lb 14.4 oz)    Examination:  General exam: Alert, awake, oriented x 3 Respiratory system: Clear to auscultation. Respiratory effort normal. Cardiovascular system:RRR. No murmurs, rubs, gallops. Gastrointestinal system: Abdomen is nondistended, soft and nontender. No organomegaly or masses felt. Normal bowel sounds heard. Central nervous system: Alert and oriented. No focal neurological deficits. Extremities: No C/C/E, +pedal pulses Skin: No rashes, lesions or ulcers Psychiatry: Judgement and insight appear normal. Mood &  affect appropriate.     Data Reviewed: I have personally reviewed following labs and imaging studies  CBC:  Recent Labs Lab 08/10/16 2058 08/11/16 0620 08/12/16 0429  WBC 14.4* 16.3* 11.2*  NEUTROABS 11.9*  --   --   HGB 13.0 11.6* 10.9*  HCT 39.9 35.2* 33.3*  MCV 93.9 94.9 94.9  PLT 217 194 188   Basic Metabolic Panel:  Recent Labs Lab 08/10/16 2058 08/11/16 0620 08/12/16 0429  NA 133* 137 136  K 4.9 4.7 4.7  CL 98* 104 106  CO2 27 27 25   GLUCOSE 177* 94 93  BUN 22* 18 13  CREATININE 1.61* 1.45* 1.42*  CALCIUM 8.2* 7.7* 7.9*   GFR: Estimated Creatinine Clearance: 53.2 mL/min (by C-G formula based on SCr of 1.42 mg/dL (H)). Liver Function Tests:  Recent Labs Lab 08/10/16 2058 08/11/16 0620  AST  17 10*  ALT 9* 7*  ALKPHOS 77 62  BILITOT 0.4 0.5  PROT 7.7 6.2*  ALBUMIN 3.3* 2.6*   No results for input(s): LIPASE, AMYLASE in the last 168 hours. No results for input(s): AMMONIA in the last 168 hours. Coagulation Profile: No results for input(s): INR, PROTIME in the last 168 hours. Cardiac Enzymes: No results for input(s): CKTOTAL, CKMB, CKMBINDEX, TROPONINI in the last 168 hours. BNP (last 3 results) No results for input(s): PROBNP in the last 8760 hours. HbA1C: No results for input(s): HGBA1C in the last 72 hours. CBG: No results for input(s): GLUCAP in the last 168 hours. Lipid Profile: No results for input(s): CHOL, HDL, LDLCALC, TRIG, CHOLHDL, LDLDIRECT in the last 72 hours. Thyroid Function Tests:  Recent Labs  08/10/16 2053  TSH 6.164*   Anemia Panel:  Recent Labs  08/10/16 2053  VITAMINB12 564   Urine analysis:    Component Value Date/Time   COLORURINE YELLOW 08/10/2016 2058   APPEARANCEUR CLEAR 08/10/2016 2058   LABSPEC 1.018 08/10/2016 2058   PHURINE 7.0 08/10/2016 2058   GLUCOSEU 50 (A) 08/10/2016 2058   HGBUR NEGATIVE 08/10/2016 2058   BILIRUBINUR NEGATIVE 08/10/2016 2058   KETONESUR NEGATIVE 08/10/2016 2058   PROTEINUR NEGATIVE 08/10/2016 2058   UROBILINOGEN 0.2 12/12/2014 1830   NITRITE NEGATIVE 08/10/2016 2058   LEUKOCYTESUR NEGATIVE 08/10/2016 2058   Sepsis Labs: @LABRCNTIP (procalcitonin:4,lacticidven:4)  ) Recent Results (from the past 240 hour(s))  Urine culture     Status: None   Collection Time: 08/10/16  8:58 PM  Result Value Ref Range Status   Specimen Description URINE, CATHETERIZED  Final   Special Requests NONE  Final   Culture   Final    NO GROWTH Performed at [facility], [address] [place], [place] [address]    Report Status 08/12/2016 FINAL  Final  Blood Culture (routine x 2)     Status: None (Preliminary result)   Collection Time: 08/10/16  9:10 PM  Result Value Ref Range Status   Specimen Description  LEFT ANTECUBITAL  Final   Special Requests BOTTLES DRAWN AEROBIC AND ANAEROBIC 6CC  Final   Culture NO GROWTH 2 DAYS  Final   Report Status PENDING  Incomplete  Blood Culture (routine x 2)     Status: None (Preliminary result)   Collection Time: 08/10/16  9:27 PM  Result Value Ref Range Status   Specimen Description LEFT ANTECUBITAL  Final   Special Requests BOTTLES DRAWN AEROBIC AND ANAEROBIC 6CC  Final   Culture NO GROWTH 2 DAYS  Final   Report Status PENDING  Incomplete  Radiology Studies: Dg Chest 2 View  Result Date: 08/10/2016 CLINICAL DATA:  Patient presents with recent chills. Former smoker and hypertension. EXAM: CHEST  2 VIEW COMPARISON:  02/09/2016 CXR, 02/24/2016 chest CT FINDINGS: Heart is top-normal in size. The aorta is atherosclerotic at its arch. Mild diffuse interstitial prominence may reflect mild interstitial edema. Slightly more confluent left lower lobe airspace opacities are suspicious for left lower lobe pneumonia. No acute osseous abnormality. No effusion or pneumothorax. IMPRESSION: Mild interstitial edema. Left lower lobe retrocardiac opacities suspicious for left lower lobe pneumonia. Electronically Signed   By: Tollie Eth M.D.   On: 08/10/2016 22:22        Scheduled Meds: . albuterol  3 mL Inhalation BID  . aspirin EC  81 mg Oral Daily  . azithromycin  500 mg Intravenous Q24H  . cefTRIAXone (ROCEPHIN)  IV  1 g Intravenous Q24H  . darifenacin  7.5 mg Oral Daily  . divalproex  1,000 mg Oral QHS  . enalapril  10 mg Oral Daily  . enoxaparin (LOVENOX) injection  40 mg Subcutaneous Q24H  . [START ON 08/13/2016] levothyroxine  50 mcg Oral QAC breakfast  . mouth rinse  15 mL Mouth Rinse BID  . OLANZapine  20 mg Oral QHS  . simvastatin  40 mg Oral QHS  . sodium chloride flush  3 mL Intravenous Q12H   Continuous Infusions: . sodium chloride 100 mL/hr at 08/12/16 0136     LOS: 2 days    Time spent: 25 minutes. Greater than 50% of this time was  spent in direct contact with the patient coordinating care.     Chaya Jan, MD [facility] Pager [phone]  If 7PM-7AM, please contact night-coverage [email or web address] Password [email or web address] 08/12/2016, 11:13 AM

## 2016-08-13 DIAGNOSIS — J9601 Acute respiratory failure with hypoxia: Secondary | ICD-10-CM

## 2016-08-13 LAB — BASIC METABOLIC PANEL
Anion gap: 5 (ref 5–15)
BUN: 10 mg/dL (ref 6–20)
CALCIUM: 8 mg/dL — AB (ref 8.9–10.3)
CHLORIDE: 109 mmol/L (ref 101–111)
CO2: 25 mmol/L (ref 22–32)
CREATININE: 1.23 mg/dL (ref 0.61–1.24)
GFR calc non Af Amer: 56 mL/min — ABNORMAL LOW (ref >60)
GLUCOSE: 95 mg/dL (ref 65–99)
Potassium: 4.7 mmol/L (ref 3.5–5.1)
Sodium: 139 mmol/L (ref 135–145)

## 2016-08-13 LAB — CBC
HCT: 34.2 % — ABNORMAL LOW (ref 39.0–52.0)
Hemoglobin: 11.1 g/dL — ABNORMAL LOW (ref 13.0–17.0)
MCH: 30.6 pg (ref 26.0–34.0)
MCHC: 32.5 g/dL (ref 30.0–36.0)
MCV: 94.2 fL (ref 78.0–100.0)
PLATELETS: 189 10*3/uL (ref 150–400)
RBC: 3.63 MIL/uL — ABNORMAL LOW (ref 4.22–5.81)
RDW: 13.7 % (ref 11.5–15.5)
WBC: 9.5 10*3/uL (ref 4.0–10.5)

## 2016-08-13 MED ORDER — LEVOFLOXACIN 500 MG PO TABS: 500.0000 mg | ORAL_TABLET | Freq: Every day | ORAL | 0 refills | Status: AC

## 2016-08-13 MED ORDER — LEVOTHYROXINE SODIUM 50 MCG PO TABS
50.0000 ug | ORAL_TABLET | Freq: Every day | ORAL | 2 refills | Status: AC
Start: 2016-08-14 — End: ?

## 2016-08-13 NOTE — Discharge Summary (Signed)
Physician Discharge Summary  Roger Frye:[id number] DOB: July 02, 1941 DOA: 08/10/2016  PCP: Milana Obey, MD  Admit date: 08/10/2016 Discharge date: 08/13/2016  Time spent: 45 minutes  Recommendations for Outpatient Follow-up:  -To be discharged home today. -Advised follow-up with primary care provider in 2 weeks. -2 complete antibiotics, 5 more days of Levaquin pending at discharge.   Discharge Diagnoses:  Principal Problem:   CAP (community acquired pneumonia) Active Problems:   Schizophrenia (HCC)   Hypertension   Acute kidney injury (HCC)   Acute respiratory failure with hypoxia (HCC)   CKD (chronic kidney disease), stage III   Discharge Condition: Stable and improved  Filed Weights   08/11/16 0123  Weight: 97 kg (213 lb 14.4 oz)    History of present illness:  As per Dr. Conley Rolls on 1/24: Roger Frye is an 76 y.o. male with hx of prior CAP, schizophrenia, HTN, HLD, hx of prior stroke, lives at home with his older brother, brought to the ER as he was feeling weak, and unable to walk.  He was found to have a low grade fever of 100.4.  He has a non productive coughs, but denied diffuse myalgia.  Work up in the ER showed leukocytosis with WBC of 14K, CXR showed LLL infiltrate.  His Cr was 1.6, and BS was 177.  BC were obtained, and he was given IV Rocephin and Zithromax for CAP.  Hospitalist was asked to admit him for same.    Hospital Course:   Generalized weakness -Due to community-acquired pneumonia. -Culture data remains negative, will discharge on Levaquin for an additional 5 days of treatment with antibiotics. -B12 WNL, TSH high (see below) -Appreciate PT evaluation, home health PT versus SNF, patient feels comfortable returning home and says that brother will assist as needed.  Hypothyroidism -TSH is elevated at 6.164. -He states he has been compliant with his home Synthroid dose, will increase from 25-50 g daily. -Will need repeat thyroid function  tests in 4-6 weeks and dose adjustment as deemed necessary.  Acute renal failure on chronic kidney disease stage III -Baseline creatinine is around 1.5. -Creatinine is better than baseline at 1.23 on discharge..  Schizophrenia -Mood stable. -Continue antipsychotics  Procedures:  None   Consultations:  None  Discharge Instructions  Discharge Instructions    Diet - low sodium heart healthy    Complete by:  As directed    Increase activity slowly    Complete by:  As directed      Allergies as of 08/13/2016   No Known Allergies     Medication List    TAKE these medications   aspirin EC 81 MG tablet Take 81 mg by mouth daily.   divalproex 500 MG 24 hr tablet Commonly known as:  DEPAKOTE ER Take 1,000 mg by mouth at bedtime.   enalapril 10 MG tablet Commonly known as:  VASOTEC Take 10 mg by mouth daily.   levofloxacin 500 MG tablet Commonly known as:  LEVAQUIN Take 1 tablet (500 mg total) by mouth daily.   levothyroxine 50 MCG tablet Commonly known as:  SYNTHROID, LEVOTHROID Take 1 tablet (50 mcg total) by mouth daily before breakfast. Start taking on:  08/14/2016 What changed:  medication strength  how much to take   LORazepam 2 MG tablet Commonly known as:  ATIVAN Take 2 mg by mouth 4 (four) times daily as needed. FOR ANXIETY   nystatin cream Commonly known as:  MYCOSTATIN Apply topically 2 (two) times  daily. to affected area   OLANZapine 20 MG tablet Commonly known as:  ZYPREXA Take 20 mg by mouth at bedtime.   PROAIR RESPICLICK 108 (90 Base) MCG/ACT Aepb Generic drug:  Albuterol Sulfate Inhale 1 puff into the lungs 2 (two) times daily. For s   simvastatin 40 MG tablet Commonly known as:  ZOCOR Take 40 mg by mouth at bedtime.   VESICARE 5 MG tablet Generic drug:  solifenacin Take 5 mg by mouth daily.      No Known Allergies Follow-up Information    Milana Obey, MD. Schedule an appointment as soon as possible for a visit in 2  week(s).   Specialty:  Family Medicine Contact information: [address] [place] [phone]            The results of significant diagnostics from this hospitalization (including imaging, microbiology, ancillary and laboratory) are listed below for reference.    Significant Diagnostic Studies: Dg Chest 2 View  Result Date: 08/10/2016 CLINICAL DATA:  Patient presents with recent chills. Former smoker and hypertension. EXAM: CHEST  2 VIEW COMPARISON:  02/09/2016 CXR, 02/24/2016 chest CT FINDINGS: Heart is top-normal in size. The aorta is atherosclerotic at its arch. Mild diffuse interstitial prominence may reflect mild interstitial edema. Slightly more confluent left lower lobe airspace opacities are suspicious for left lower lobe pneumonia. No acute osseous abnormality. No effusion or pneumothorax. IMPRESSION: Mild interstitial edema. Left lower lobe retrocardiac opacities suspicious for left lower lobe pneumonia. Electronically Signed   By: Tollie Eth M.D.   On: 08/10/2016 22:22    Microbiology: Recent Results (from the past 240 hour(s))  Urine culture     Status: None   Collection Time: 08/10/16  8:58 PM  Result Value Ref Range Status   Specimen Description URINE, CATHETERIZED  Final   Special Requests NONE  Final   Culture   Final    NO GROWTH Performed at [facility], [address], [place], [place] [address]    Report Status 08/12/2016 FINAL  Final  Blood Culture (routine x 2)     Status: None (Preliminary result)   Collection Time: 08/10/16  9:10 PM  Result Value Ref Range Status   Specimen Description LEFT ANTECUBITAL  Final   Special Requests BOTTLES DRAWN AEROBIC AND ANAEROBIC 6CC  Final   Culture NO GROWTH 3 DAYS  Final   Report Status PENDING  Incomplete  Blood Culture (routine x 2)     Status: None (Preliminary result)   Collection Time: 08/10/16  9:27 PM  Result Value Ref Range Status   Specimen Description LEFT ANTECUBITAL  Final     Special Requests BOTTLES DRAWN AEROBIC AND ANAEROBIC 6CC  Final   Culture NO GROWTH 3 DAYS  Final   Report Status PENDING  Incomplete     Labs: Basic Metabolic Panel:  Recent Labs Lab 08/10/16 2058 08/11/16 0620 08/12/16 0429 08/13/16 0705  NA 133* 137 136 139  K 4.9 4.7 4.7 4.7  CL 98* 104 106 109  CO2 27 27 25 25   GLUCOSE 177* 94 93 95  BUN 22* 18 13 10   CREATININE 1.61* 1.45* 1.42* 1.23  CALCIUM 8.2* 7.7* 7.9* 8.0*   Liver Function Tests:  Recent Labs Lab 08/10/16 2058 08/11/16 0620  AST 17 10*  ALT 9* 7*  ALKPHOS 77 62  BILITOT 0.4 0.5  PROT 7.7 6.2*  ALBUMIN 3.3* 2.6*   No results for input(s): LIPASE, AMYLASE in the last 168  hours. No results for input(s): AMMONIA in the last 168 hours. CBC:  Recent Labs Lab 08/10/16 2058 08/11/16 0620 08/12/16 0429 08/13/16 0705  WBC 14.4* 16.3* 11.2* 9.5  NEUTROABS 11.9*  --   --   --   HGB 13.0 11.6* 10.9* 11.1*  HCT 39.9 35.2* 33.3* 34.2*  MCV 93.9 94.9 94.9 94.2  PLT 217 194 188 189   Cardiac Enzymes: No results for input(s): CKTOTAL, CKMB, CKMBINDEX, TROPONINI in the last 168 hours. BNP: BNP (last 3 results) No results for input(s): BNP in the last 8760 hours.  ProBNP (last 3 results) No results for input(s): PROBNP in the last 8760 hours.  CBG: No results for input(s): GLUCAP in the last 168 hours.     SignedChaya Jan:  HERNANDEZ ACOSTA,Zafira Munos  [facility] Pager: [phone] 08/13/2016, 1:25 PM

## 2016-08-15 LAB — CULTURE, BLOOD (ROUTINE X 2)
CULTURE: NO GROWTH
Culture: NO GROWTH

## 2019-08-19 DEATH — deceased
